# Patient Record
Sex: Female | Born: 1980 | Race: White | Hispanic: No | Marital: Single | Smoking: Never smoker
Health system: Southern US, Community
[De-identification: ages and names within clinical notes are randomized; demographics above are authoritative.]

## PROBLEM LIST (undated history)

## (undated) DIAGNOSIS — I10 Essential (primary) hypertension: Secondary | ICD-10-CM

## (undated) DIAGNOSIS — N809 Endometriosis, unspecified: Secondary | ICD-10-CM

## (undated) DIAGNOSIS — N200 Calculus of kidney: Secondary | ICD-10-CM

## (undated) HISTORY — PX: LAPAROSCOPY: SHX197

---

## 2003-09-29 ENCOUNTER — Emergency Department (HOSPITAL_COMMUNITY): Admission: EM | Admit: 2003-09-29 | Discharge: 2003-09-29 | Payer: Self-pay | Admitting: Physical Therapy

## 2004-07-24 ENCOUNTER — Emergency Department: Payer: Self-pay | Admitting: Emergency Medicine

## 2004-09-06 ENCOUNTER — Emergency Department: Payer: Self-pay | Admitting: Emergency Medicine

## 2004-09-12 ENCOUNTER — Emergency Department: Payer: Self-pay | Admitting: Emergency Medicine

## 2004-09-18 ENCOUNTER — Emergency Department: Payer: Self-pay | Admitting: Emergency Medicine

## 2004-10-08 ENCOUNTER — Other Ambulatory Visit: Payer: Self-pay

## 2004-10-08 ENCOUNTER — Emergency Department: Payer: Self-pay | Admitting: Emergency Medicine

## 2004-10-18 ENCOUNTER — Ambulatory Visit: Payer: Self-pay | Admitting: Unknown Physician Specialty

## 2004-11-27 ENCOUNTER — Emergency Department: Payer: Self-pay | Admitting: Emergency Medicine

## 2004-12-20 ENCOUNTER — Emergency Department: Payer: Self-pay | Admitting: Unknown Physician Specialty

## 2005-01-01 ENCOUNTER — Emergency Department: Payer: Self-pay | Admitting: Emergency Medicine

## 2005-02-01 ENCOUNTER — Emergency Department: Payer: Self-pay | Admitting: Emergency Medicine

## 2005-09-21 ENCOUNTER — Ambulatory Visit: Payer: Self-pay | Admitting: Unknown Physician Specialty

## 2005-10-29 ENCOUNTER — Emergency Department: Payer: Self-pay | Admitting: Internal Medicine

## 2006-11-26 ENCOUNTER — Emergency Department: Payer: Self-pay | Admitting: Emergency Medicine

## 2007-02-19 ENCOUNTER — Observation Stay: Payer: Self-pay | Admitting: Obstetrics & Gynecology

## 2007-02-22 ENCOUNTER — Observation Stay: Payer: Self-pay | Admitting: Obstetrics and Gynecology

## 2007-02-26 ENCOUNTER — Observation Stay: Payer: Self-pay | Admitting: Obstetrics and Gynecology

## 2007-03-05 ENCOUNTER — Inpatient Hospital Stay: Payer: Self-pay | Admitting: Obstetrics & Gynecology

## 2007-08-13 ENCOUNTER — Emergency Department: Payer: Self-pay | Admitting: Emergency Medicine

## 2007-12-20 ENCOUNTER — Emergency Department: Payer: Self-pay | Admitting: Emergency Medicine

## 2008-04-14 ENCOUNTER — Ambulatory Visit: Payer: Self-pay | Admitting: Internal Medicine

## 2008-07-24 ENCOUNTER — Emergency Department: Payer: Self-pay | Admitting: Emergency Medicine

## 2008-08-23 ENCOUNTER — Emergency Department: Payer: Self-pay | Admitting: Emergency Medicine

## 2008-09-22 ENCOUNTER — Ambulatory Visit: Payer: Self-pay | Admitting: Pain Medicine

## 2008-10-06 ENCOUNTER — Ambulatory Visit: Payer: Self-pay | Admitting: Internal Medicine

## 2008-11-11 ENCOUNTER — Emergency Department: Payer: Self-pay | Admitting: Unknown Physician Specialty

## 2008-11-23 ENCOUNTER — Ambulatory Visit: Payer: Self-pay | Admitting: Pain Medicine

## 2009-03-25 ENCOUNTER — Emergency Department: Payer: Self-pay | Admitting: Emergency Medicine

## 2009-04-13 ENCOUNTER — Emergency Department: Payer: Self-pay | Admitting: Emergency Medicine

## 2009-04-24 ENCOUNTER — Ambulatory Visit: Payer: Self-pay | Admitting: Family Medicine

## 2009-06-12 ENCOUNTER — Emergency Department: Payer: Self-pay | Admitting: Emergency Medicine

## 2009-07-18 ENCOUNTER — Emergency Department: Payer: Self-pay | Admitting: Emergency Medicine

## 2009-09-24 ENCOUNTER — Emergency Department: Payer: Self-pay | Admitting: Emergency Medicine

## 2009-10-02 ENCOUNTER — Emergency Department: Payer: Self-pay | Admitting: Emergency Medicine

## 2009-11-02 ENCOUNTER — Emergency Department: Payer: Self-pay | Admitting: Emergency Medicine

## 2009-12-12 ENCOUNTER — Emergency Department: Payer: Self-pay | Admitting: Emergency Medicine

## 2009-12-14 ENCOUNTER — Emergency Department: Payer: Self-pay | Admitting: Internal Medicine

## 2009-12-27 ENCOUNTER — Emergency Department: Payer: Self-pay | Admitting: Emergency Medicine

## 2010-03-09 ENCOUNTER — Emergency Department: Payer: Self-pay | Admitting: Emergency Medicine

## 2010-08-23 ENCOUNTER — Emergency Department: Payer: Self-pay | Admitting: Emergency Medicine

## 2010-11-26 ENCOUNTER — Emergency Department: Payer: Self-pay | Admitting: *Deleted

## 2011-01-05 ENCOUNTER — Emergency Department: Payer: Self-pay | Admitting: Unknown Physician Specialty

## 2011-03-17 ENCOUNTER — Emergency Department: Payer: Self-pay | Admitting: Emergency Medicine

## 2011-05-24 IMAGING — CR RIGHT FOOT COMPLETE - 3+ VIEW
1 series · 3 of 3 positions shown · non-contrast
Comparison: none

REASON FOR EXAM: right foot pain s/p fall
COMMENTS:

PROCEDURE:     DXR - DXR FOOT RT COMPLETE W/OBLIQUES  - December 12, 2009  [DATE]
RESULT:     There is an ununited fracture at the base of the fifth
metatarsal. This has been present previously. No new fractures are seen. No
radiodense soft tissue foreign body is observed.

[Series 1: view not recorded · 0.17mm/px · 3 of 3 slices shown]
[im 1/3]
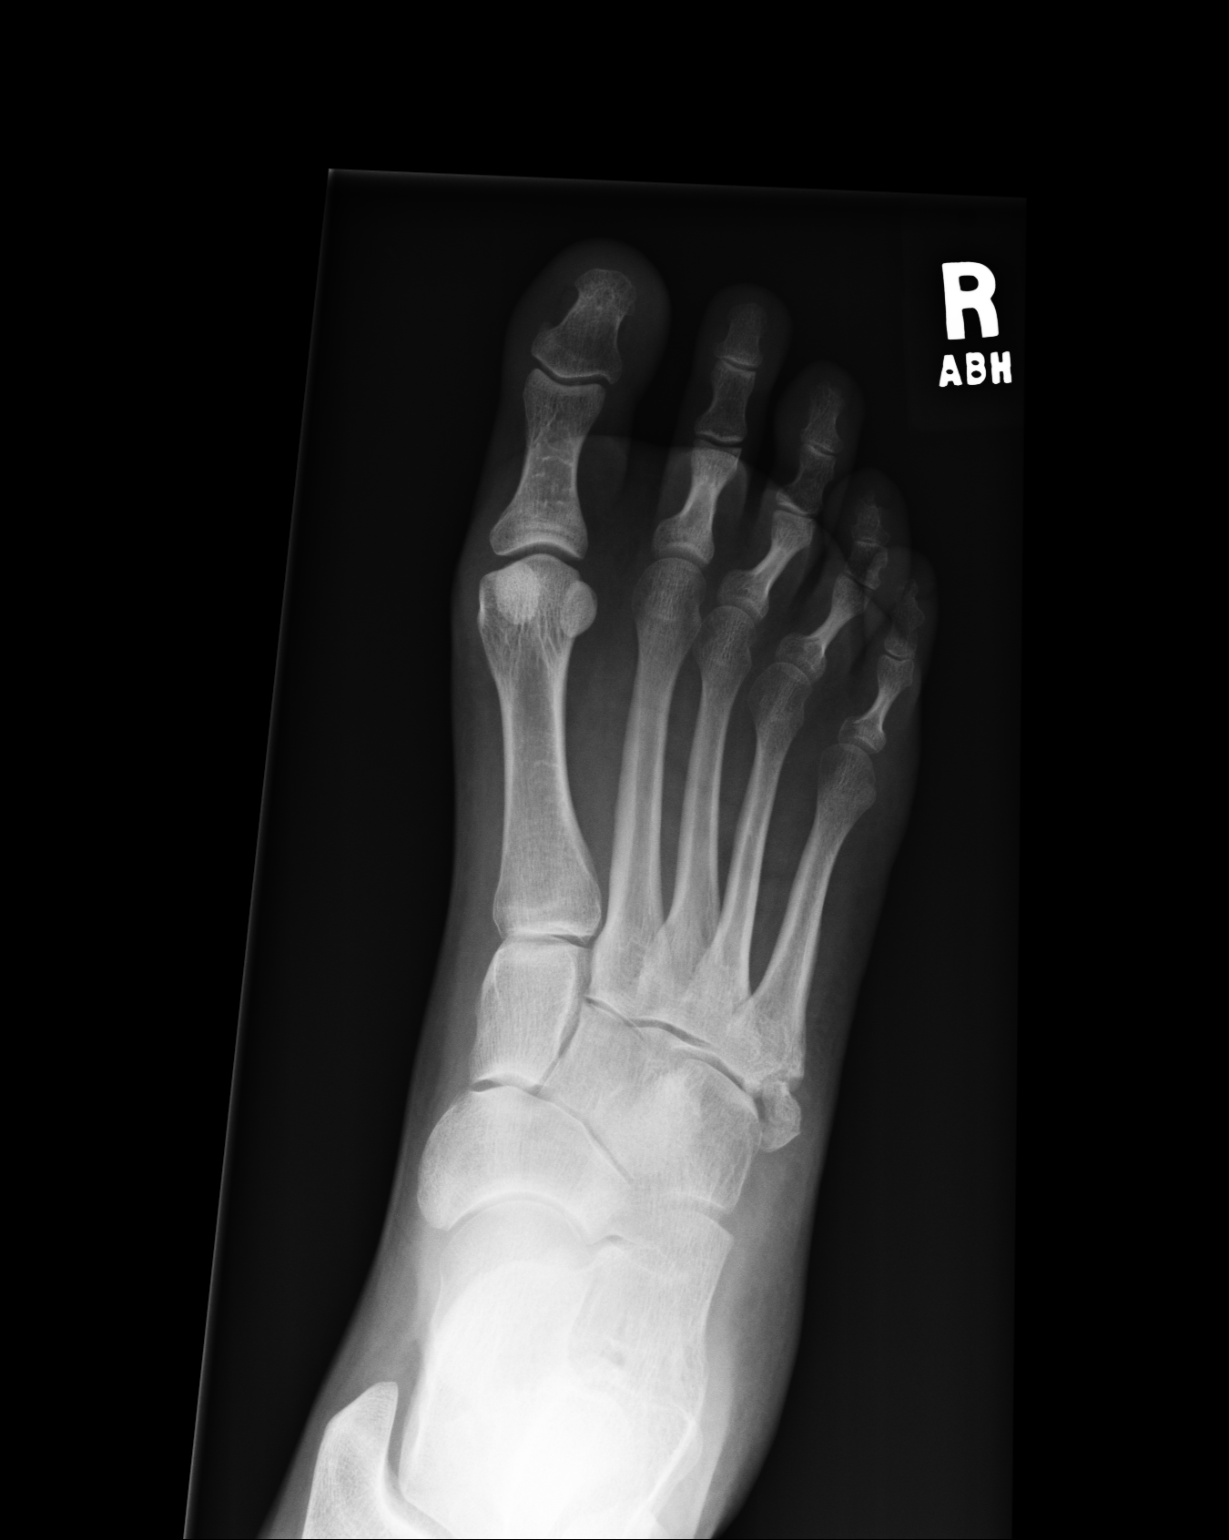
[im 2/3]
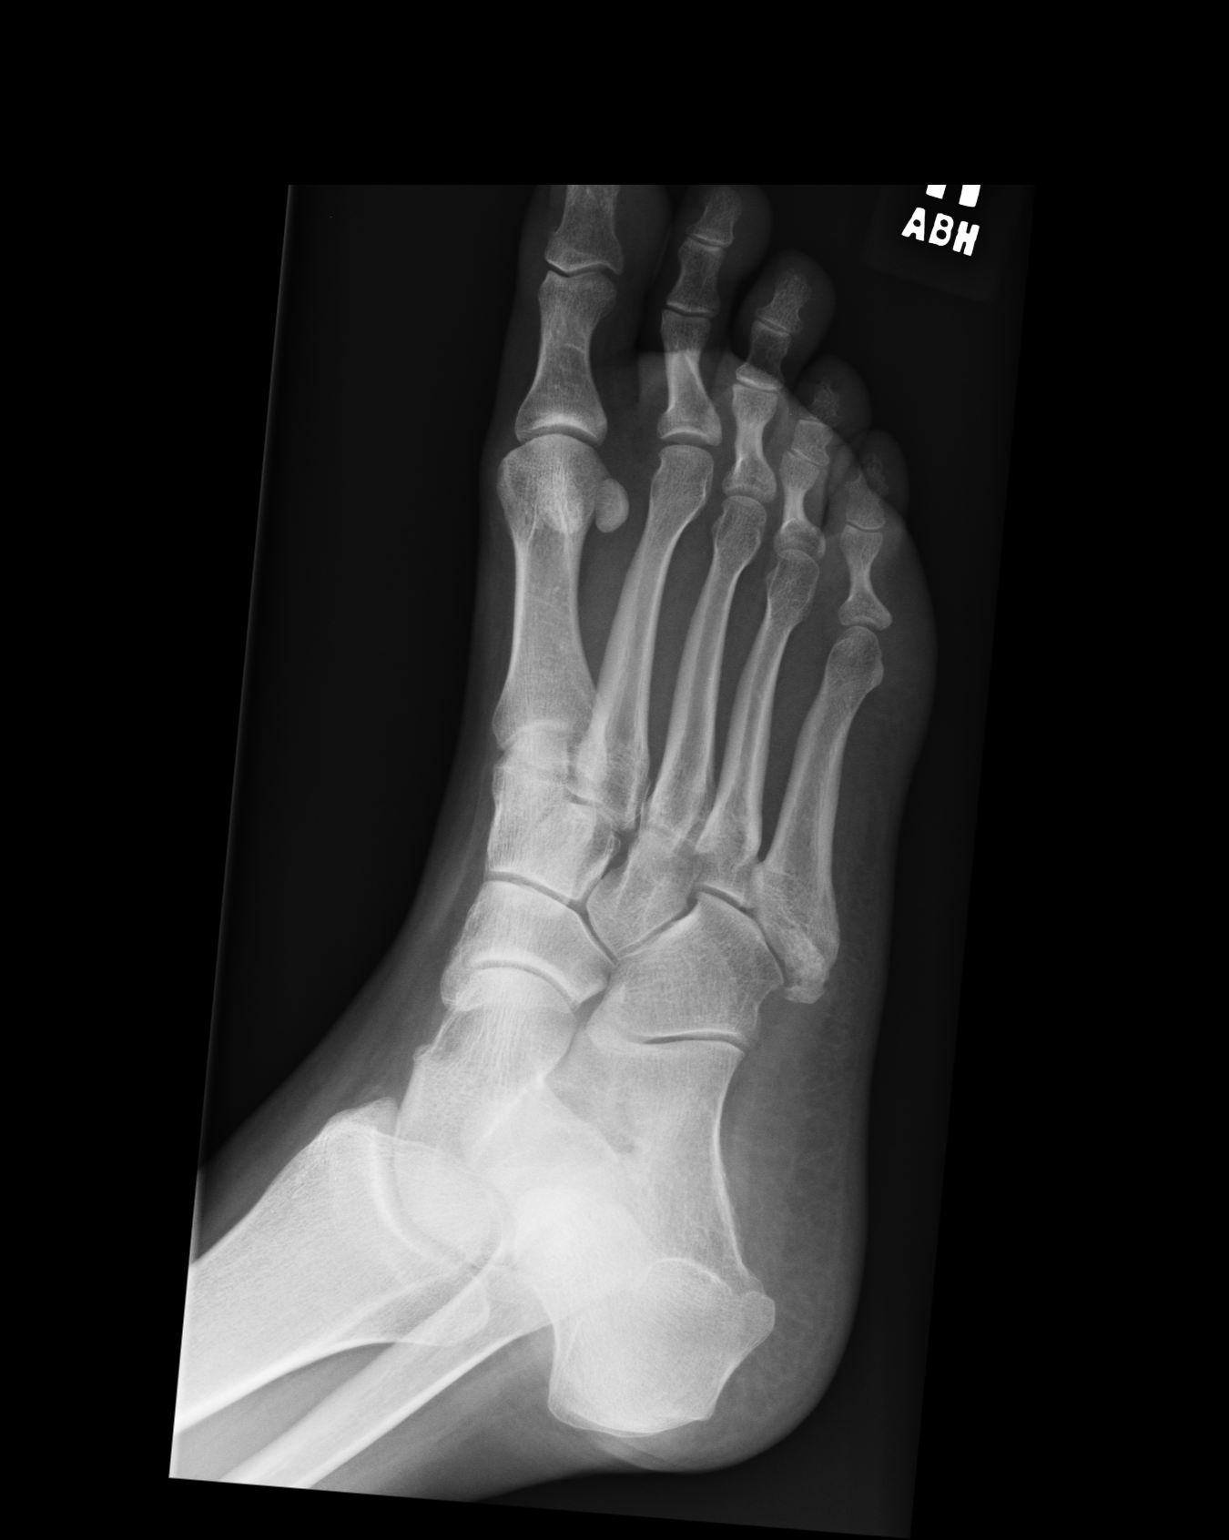
[im 3/3]
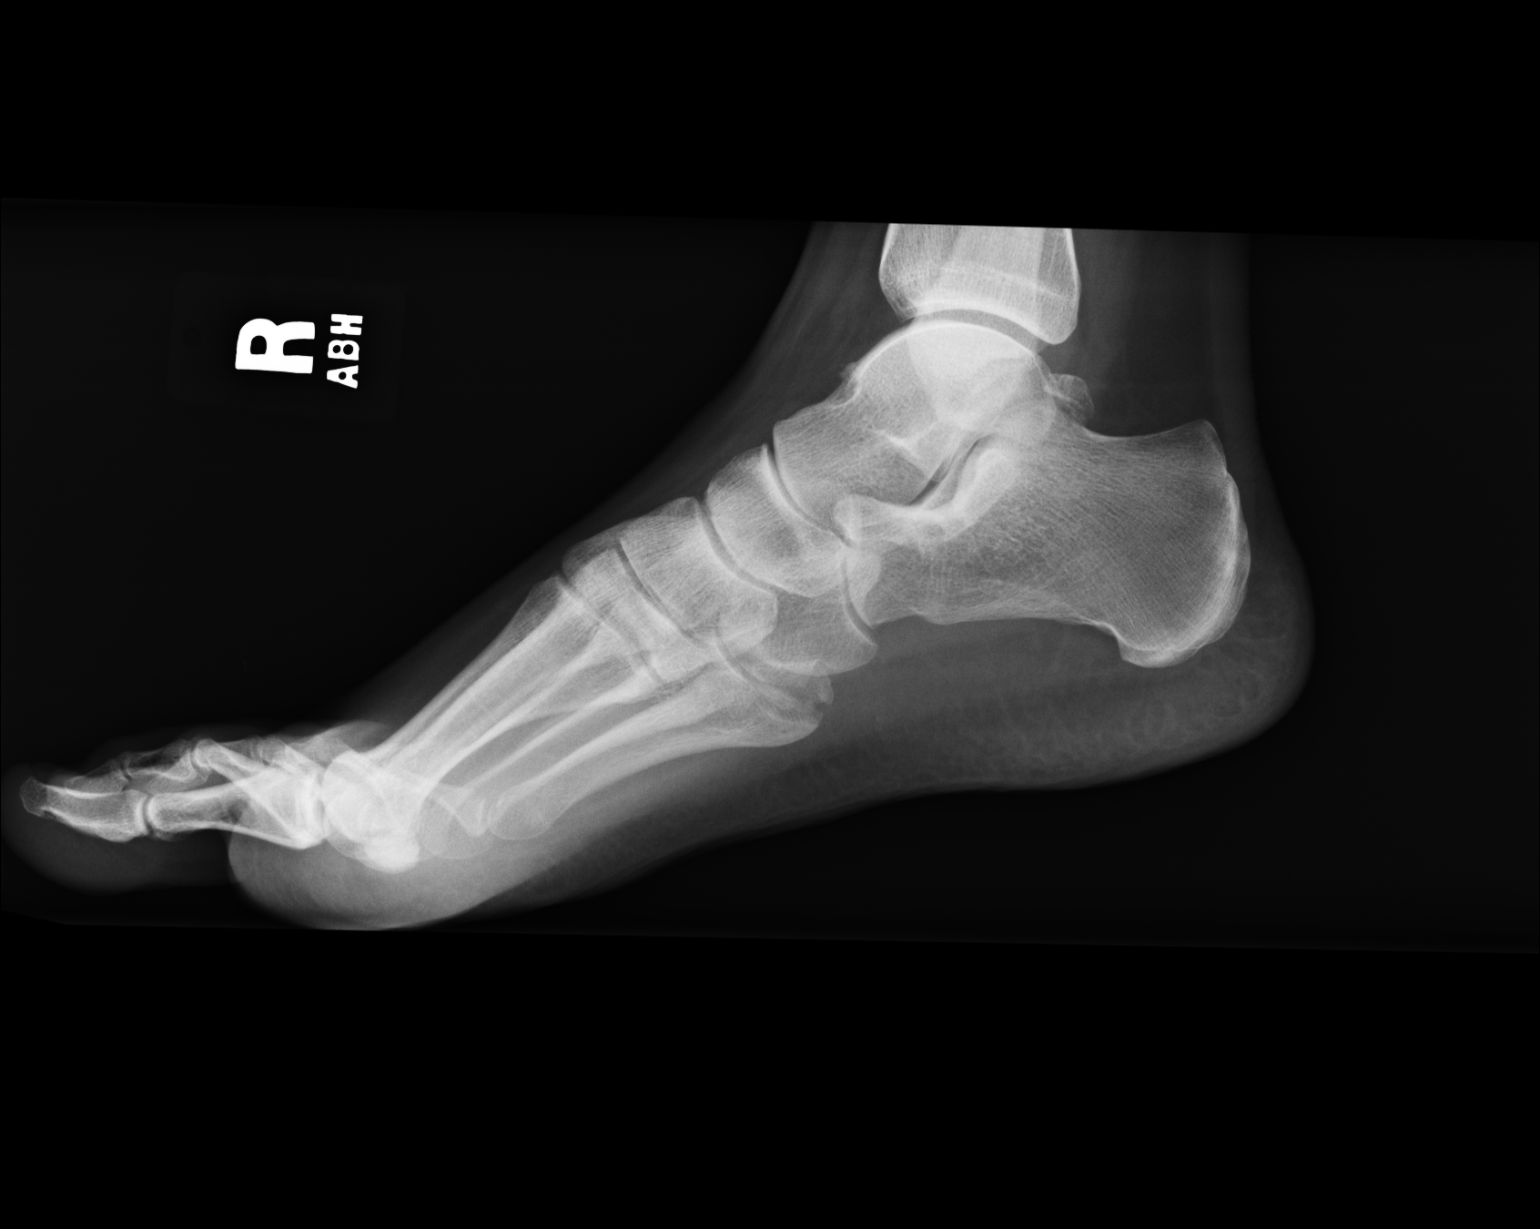

[3 of 3 positions shown; findings below may reference images not displayed]

IMPRESSION: 1. There is an old unhealed fracture at the base of the fifth metatarsal.
2. No acute bony abnormalities are identified.

## 2011-05-24 IMAGING — CR DG CHEST 2V
1 series · 2 of 2 positions shown · non-contrast
Comparison: none

REASON FOR EXAM: chest wall pain
COMMENTS:

PROCEDURE:     DXR - DXR CHEST PA (OR AP) AND LATERAL  - December 12, 2009  [DATE]
RESULT:     Comparison is made to s prior exam of 06/12/2009. The lung fields
are clear. No pneumonia, pneumothorax or pleural effusion is seen. Heart
size is normal.

[Series 1: view not recorded · 0.17mm/px · 2 of 2 slices shown]
[im 1/2]
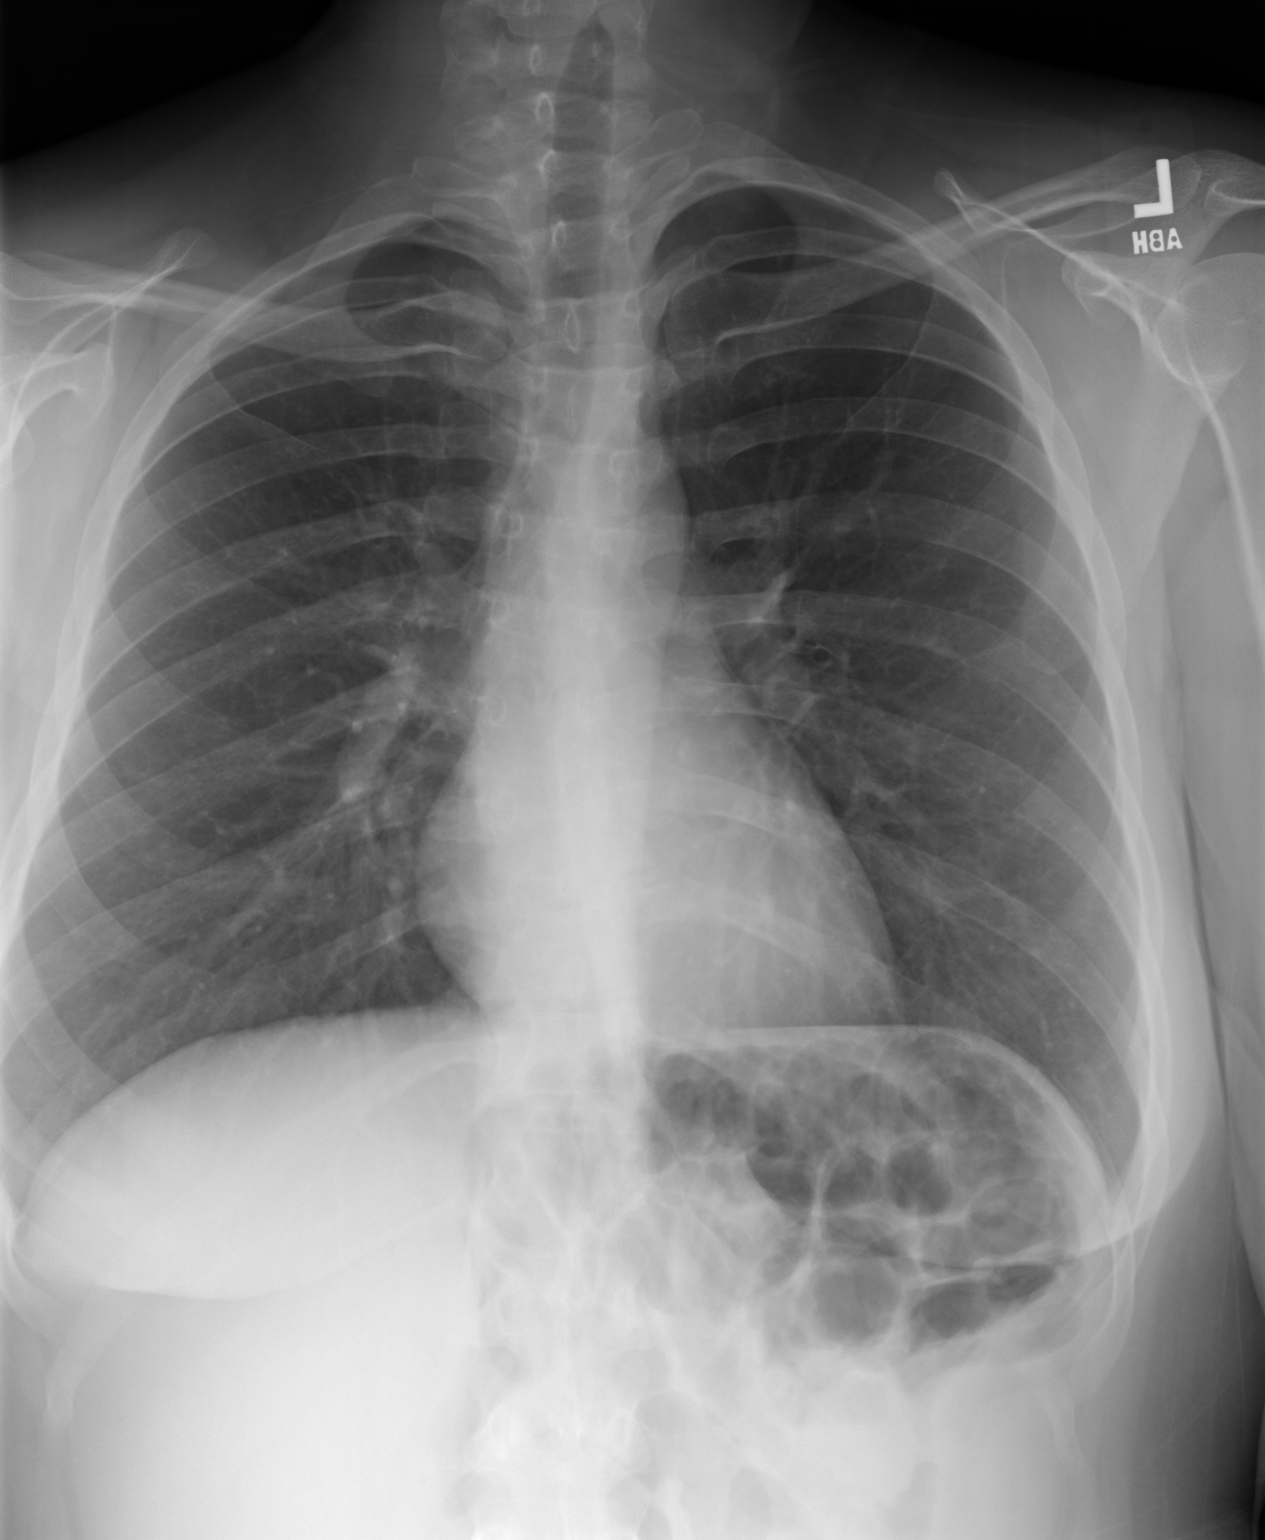
[im 2/2]
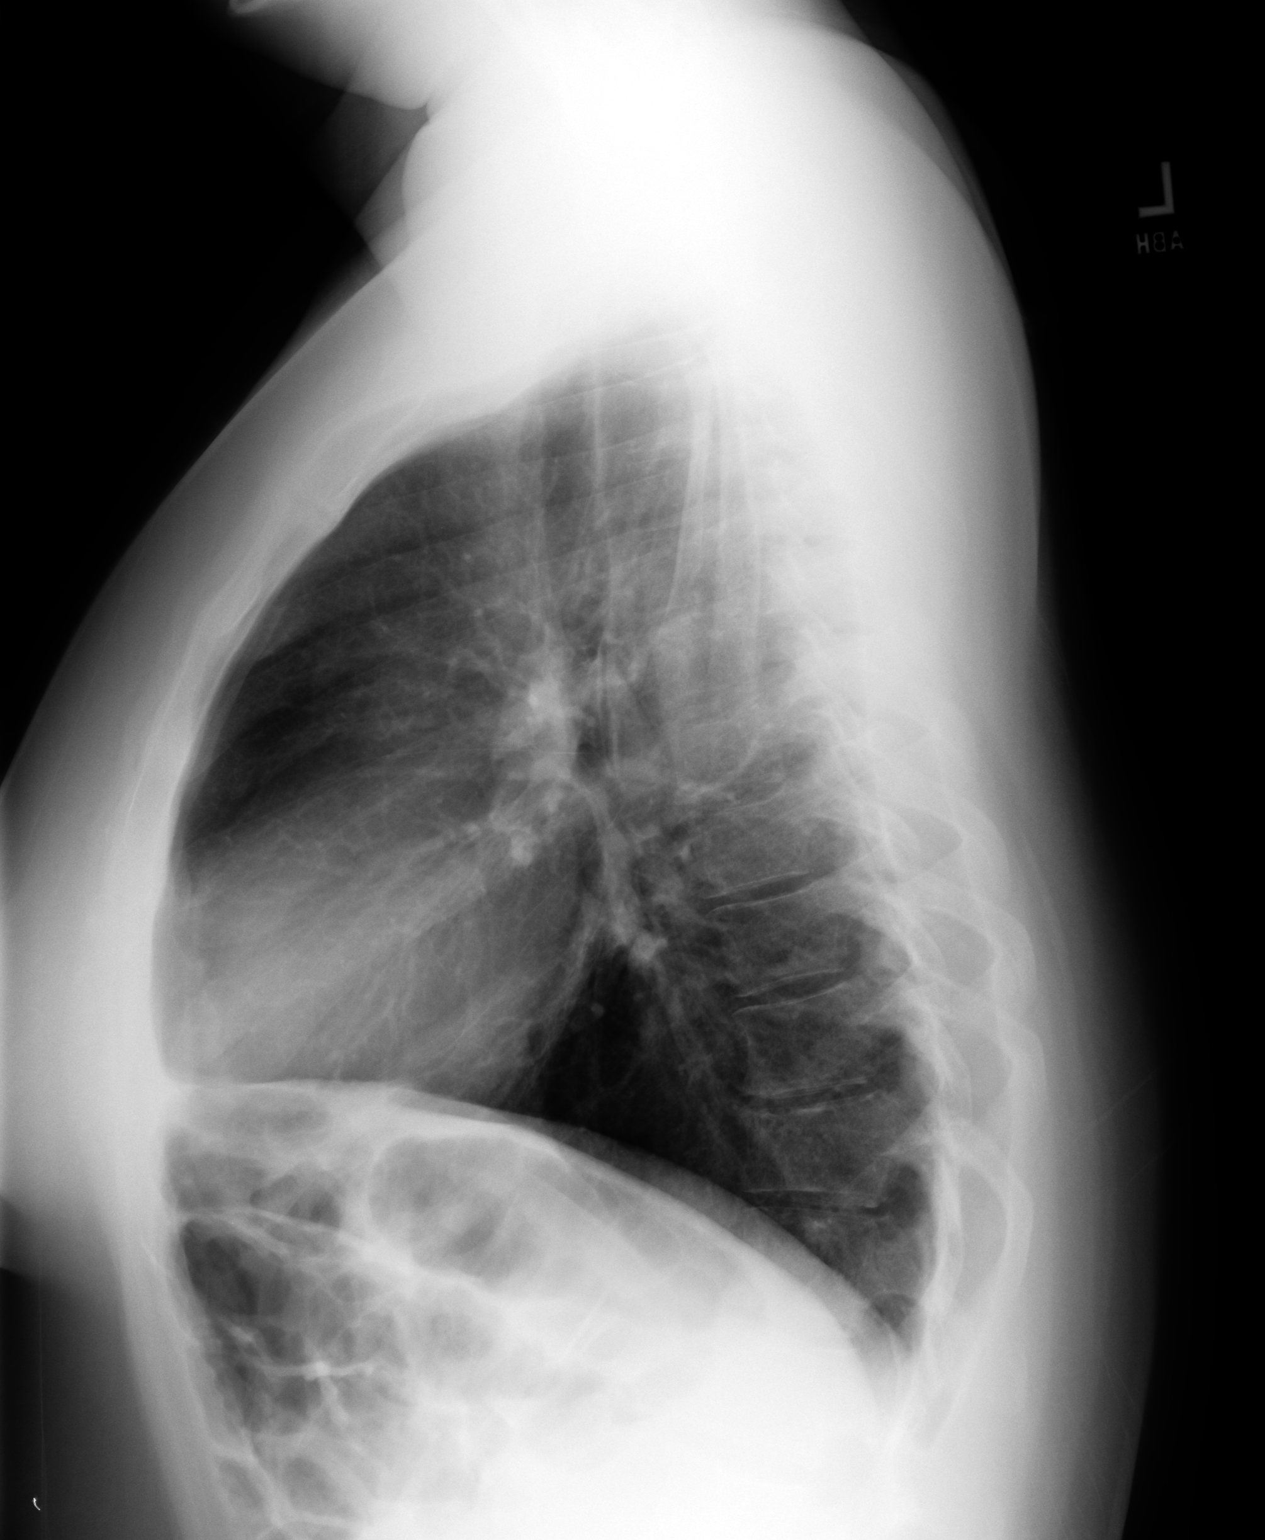

[2 of 2 positions shown; findings below may reference images not displayed]

IMPRESSION: No acute changes are identified.

## 2011-05-24 IMAGING — CR DG THORACIC SPINE 2-3V
1 series · 2 of 2 positions shown · non-contrast
Comparison: none

REASON FOR EXAM: back pain s/p fall
COMMENTS:

[Series 1: view not recorded · 0.17mm/px · 2 of 2 slices shown]
[im 1/2]
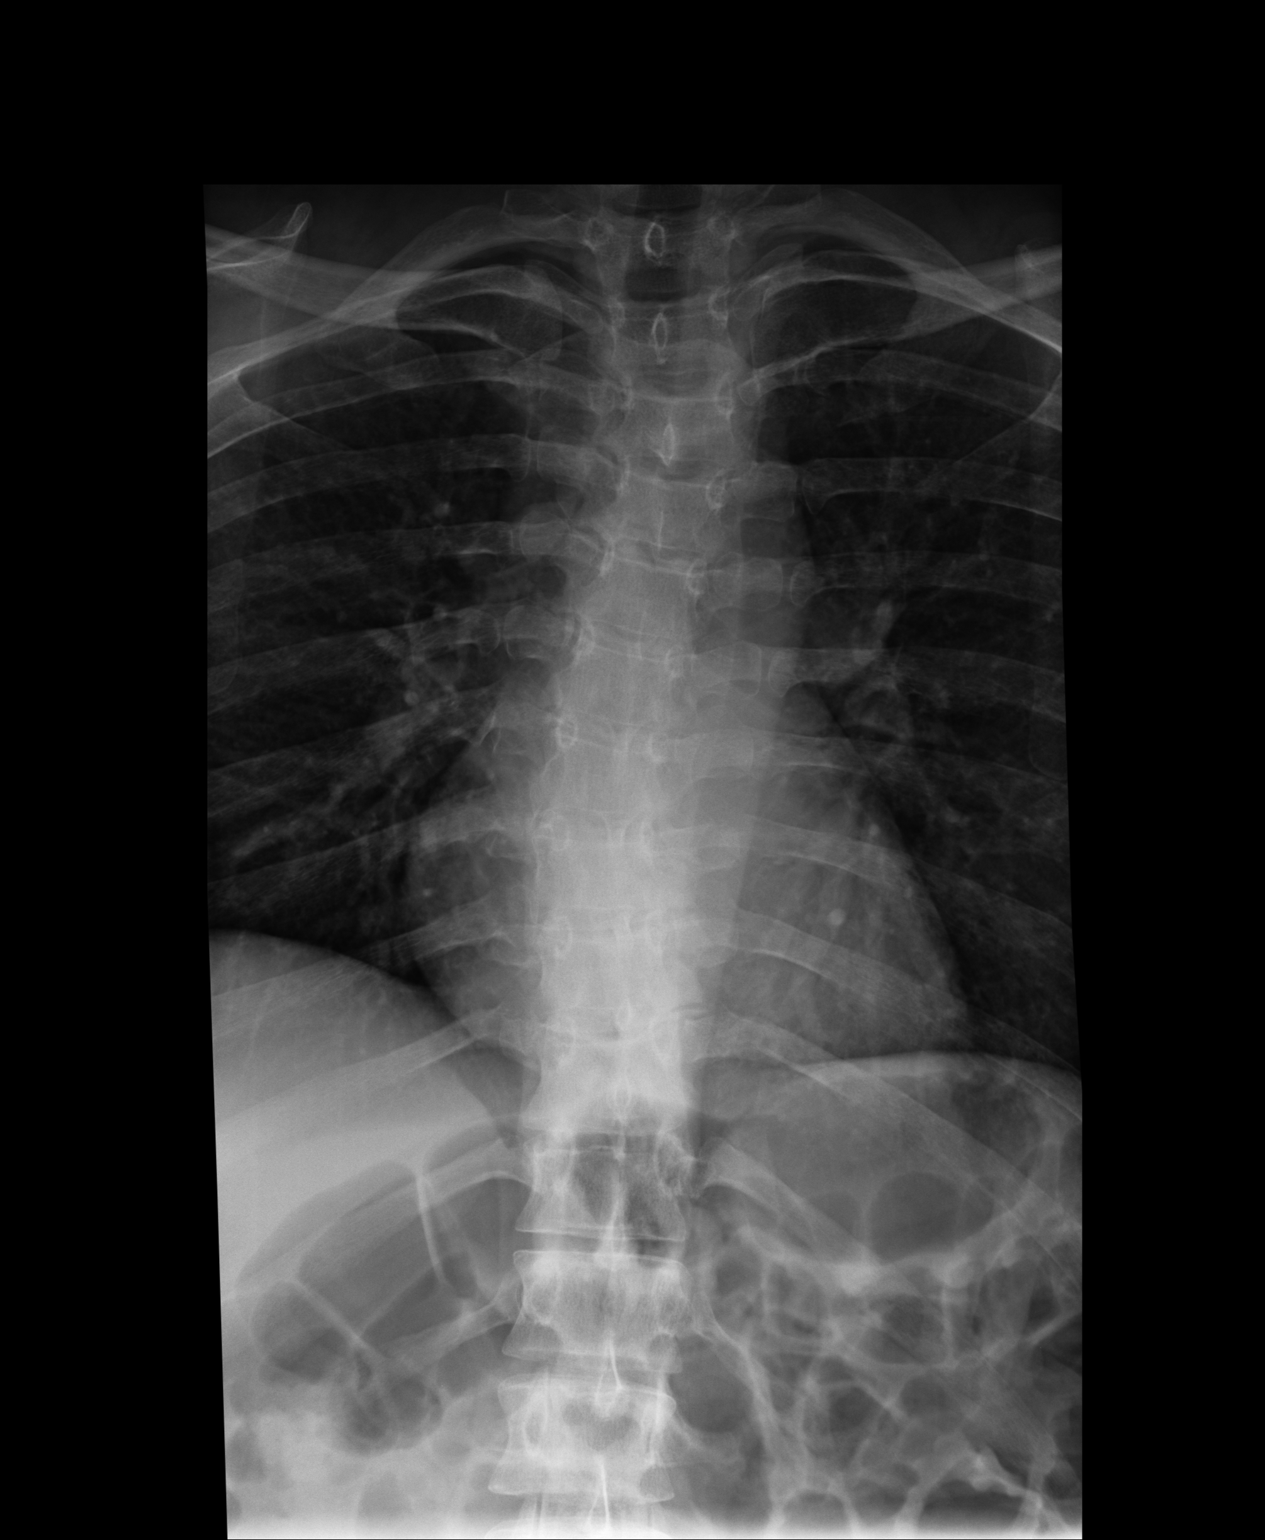
[im 2/2]
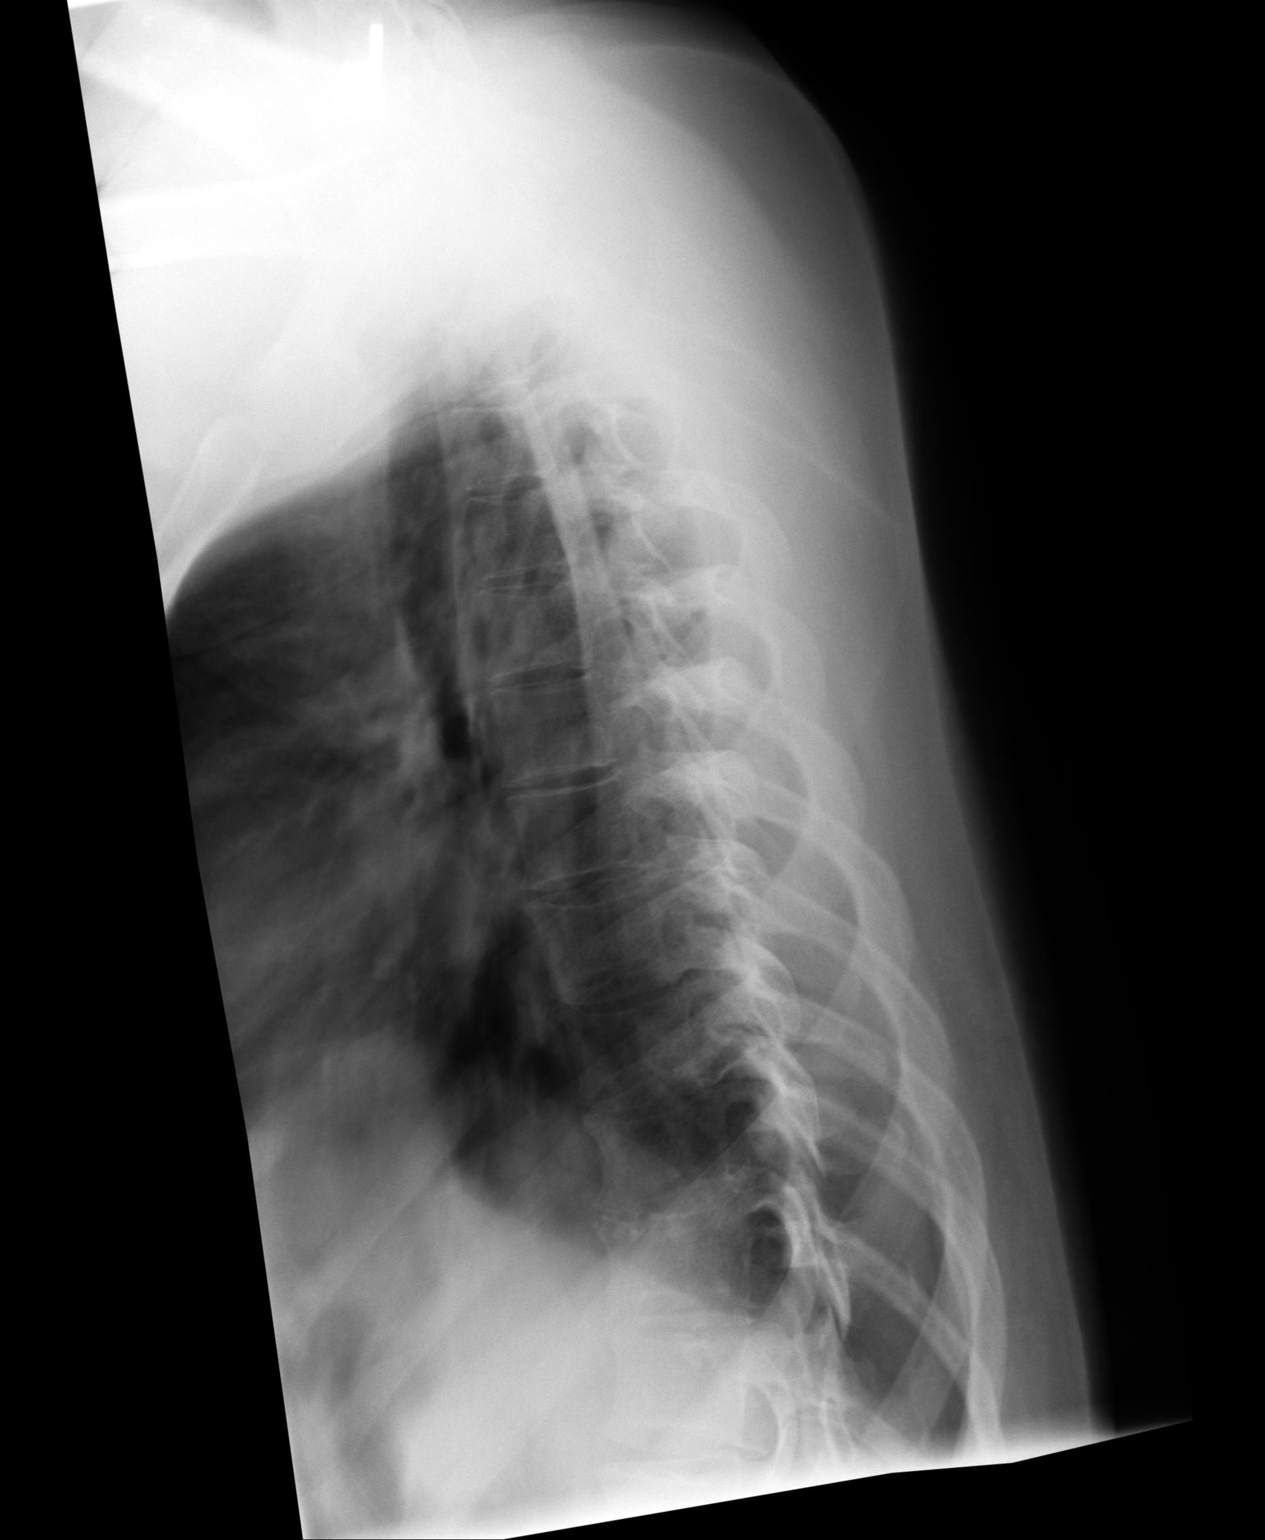

[2 of 2 positions shown; findings below may reference images not displayed]

PROCEDURE:     DXR - DXR THORACIC  AP AND LATERAL  - December 12, 2009  [DATE]

RESULT:     There is slight anterior wedge deformity of T11. This was also
present on a prior exam of 07/18/2009. No acute bony abnormalities are
identified. No fracture is seen. The vertebral body alignment is normal. The
pedicles are bilaterally intact. Incidental note is made of a slight
thoracic scoliosis with a convexity to the right.
IMPRESSION: No acute bony abnormalities are identified.

## 2011-06-08 ENCOUNTER — Emergency Department: Payer: Self-pay | Admitting: Emergency Medicine

## 2011-08-21 ENCOUNTER — Emergency Department: Payer: Self-pay | Admitting: Emergency Medicine

## 2011-11-30 ENCOUNTER — Emergency Department: Payer: Self-pay | Admitting: Emergency Medicine

## 2011-12-04 LAB — WOUND CULTURE

## 2011-12-05 ENCOUNTER — Emergency Department: Payer: Self-pay | Admitting: Emergency Medicine

## 2011-12-09 ENCOUNTER — Emergency Department: Payer: Self-pay | Admitting: Emergency Medicine

## 2012-01-25 ENCOUNTER — Emergency Department: Payer: Self-pay | Admitting: Emergency Medicine

## 2012-01-25 LAB — URINALYSIS, COMPLETE
Bilirubin,UR: NEGATIVE
Ph: 5 (ref 4.5–8.0)
RBC,UR: NONE SEEN /HPF (ref 0–5)
Specific Gravity: 1.033 (ref 1.003–1.030)
Squamous Epithelial: NONE SEEN
WBC UR: NONE SEEN /HPF (ref 0–5)

## 2012-02-02 ENCOUNTER — Emergency Department: Payer: Self-pay | Admitting: Emergency Medicine

## 2012-03-01 ENCOUNTER — Emergency Department: Payer: Self-pay | Admitting: Emergency Medicine

## 2012-03-09 ENCOUNTER — Emergency Department: Payer: Self-pay | Admitting: Emergency Medicine

## 2012-06-07 ENCOUNTER — Emergency Department: Payer: Self-pay | Admitting: Emergency Medicine

## 2012-07-17 ENCOUNTER — Emergency Department: Payer: Self-pay | Admitting: Unknown Physician Specialty

## 2012-07-31 ENCOUNTER — Emergency Department: Payer: Self-pay | Admitting: Internal Medicine

## 2012-08-29 ENCOUNTER — Emergency Department: Payer: Self-pay | Admitting: Emergency Medicine

## 2012-09-01 ENCOUNTER — Emergency Department: Payer: Self-pay | Admitting: Emergency Medicine

## 2012-12-04 ENCOUNTER — Emergency Department: Payer: Self-pay | Admitting: Emergency Medicine

## 2012-12-05 ENCOUNTER — Emergency Department: Payer: Self-pay | Admitting: Emergency Medicine

## 2012-12-20 ENCOUNTER — Emergency Department: Payer: Self-pay | Admitting: Emergency Medicine

## 2012-12-20 LAB — BASIC METABOLIC PANEL
Anion Gap: 4 — ABNORMAL LOW (ref 7–16)
BUN: 5 mg/dL — ABNORMAL LOW (ref 7–18)
Calcium, Total: 9.5 mg/dL (ref 8.5–10.1)
Co2: 30 mmol/L (ref 21–32)
EGFR (African American): >60
Sodium: 138 mmol/L (ref 136–145)

## 2012-12-20 LAB — CBC
HGB: 12.7 g/dL (ref 12.0–16.0)
MCHC: 33.7 g/dL (ref 32.0–36.0)
MCV: 87 fL (ref 80–100)
RBC: 4.32 10*6/uL (ref 3.80–5.20)
WBC: 5.6 10*3/uL (ref 3.6–11.0)

## 2012-12-24 LAB — WOUND CULTURE

## 2013-01-24 ENCOUNTER — Emergency Department: Payer: Self-pay | Admitting: Emergency Medicine

## 2013-07-01 ENCOUNTER — Emergency Department: Payer: Self-pay | Admitting: Emergency Medicine

## 2013-07-01 LAB — URINALYSIS, COMPLETE
BILIRUBIN, UR: NEGATIVE
BLOOD: NEGATIVE
Bacteria: NONE SEEN
GLUCOSE, UR: NEGATIVE mg/dL (ref 0–75)
Ketone: NEGATIVE
LEUKOCYTE ESTERASE: NEGATIVE
Nitrite: NEGATIVE
PROTEIN: NEGATIVE
Ph: 6 (ref 4.5–8.0)
RBC, UR: NONE SEEN /HPF (ref 0–5)
SPECIFIC GRAVITY: 1.003 (ref 1.003–1.030)
Squamous Epithelial: <1

## 2013-07-04 ENCOUNTER — Emergency Department: Payer: Self-pay | Admitting: Emergency Medicine

## 2013-07-24 ENCOUNTER — Emergency Department: Payer: Self-pay | Admitting: Emergency Medicine

## 2013-07-25 LAB — URINALYSIS, COMPLETE
BACTERIA: NONE SEEN
BLOOD: NEGATIVE
Bilirubin,UR: NEGATIVE
Glucose,UR: NEGATIVE mg/dL (ref 0–75)
KETONE: NEGATIVE
LEUKOCYTE ESTERASE: NEGATIVE
NITRITE: NEGATIVE
Ph: 6 (ref 4.5–8.0)
RBC,UR: 2 /HPF (ref 0–5)
SPECIFIC GRAVITY: 1.021 (ref 1.003–1.030)

## 2013-07-25 LAB — COMPREHENSIVE METABOLIC PANEL
ALBUMIN: 3.7 g/dL (ref 3.4–5.0)
ALK PHOS: 44 U/L — AB
ALT: 15 U/L (ref 12–78)
ANION GAP: 7 (ref 7–16)
AST: 25 U/L (ref 15–37)
BILIRUBIN TOTAL: 0.5 mg/dL (ref 0.2–1.0)
BUN: 10 mg/dL (ref 7–18)
CHLORIDE: 107 mmol/L (ref 98–107)
CREATININE: 1.07 mg/dL (ref 0.60–1.30)
Calcium, Total: 8.8 mg/dL (ref 8.5–10.1)
Co2: 29 mmol/L (ref 21–32)
EGFR (African American): >60
EGFR (Non-African Amer.): >60
Glucose: 93 mg/dL (ref 65–99)
OSMOLALITY: 284 (ref 275–301)
Potassium: 2.8 mmol/L — ABNORMAL LOW (ref 3.5–5.1)
SODIUM: 143 mmol/L (ref 136–145)
TOTAL PROTEIN: 7 g/dL (ref 6.4–8.2)

## 2013-07-25 LAB — CBC WITH DIFFERENTIAL/PLATELET
BASOS PCT: 0.6 %
Basophil #: 0 10*3/uL (ref 0.0–0.1)
EOS ABS: 0.1 10*3/uL (ref 0.0–0.7)
Eosinophil %: 1.5 %
HCT: 36.1 % (ref 35.0–47.0)
HGB: 11.9 g/dL — AB (ref 12.0–16.0)
LYMPHS PCT: 36.8 %
Lymphocyte #: 2.6 10*3/uL (ref 1.0–3.6)
MCH: 29.5 pg (ref 26.0–34.0)
MCHC: 33 g/dL (ref 32.0–36.0)
MCV: 90 fL (ref 80–100)
MONO ABS: 0.5 x10 3/mm (ref 0.2–0.9)
MONOS PCT: 7.6 %
NEUTROS ABS: 3.7 10*3/uL (ref 1.4–6.5)
NEUTROS PCT: 53.5 %
PLATELETS: 272 10*3/uL (ref 150–440)
RBC: 4.03 10*6/uL (ref 3.80–5.20)
RDW: 15.3 % — AB (ref 11.5–14.5)
WBC: 7 10*3/uL (ref 3.6–11.0)

## 2013-07-25 LAB — DRUG SCREEN, URINE
AMPHETAMINES, UR SCREEN: NEGATIVE (ref ?–1000)
BARBITURATES, UR SCREEN: NEGATIVE (ref ?–200)
Benzodiazepine, Ur Scrn: NEGATIVE (ref ?–200)
CANNABINOID 50 NG, UR ~~LOC~~: NEGATIVE (ref ?–50)
COCAINE METABOLITE, UR ~~LOC~~: POSITIVE (ref ?–300)
MDMA (ECSTASY) UR SCREEN: NEGATIVE (ref ?–500)
METHADONE, UR SCREEN: NEGATIVE (ref ?–300)
Opiate, Ur Screen: NEGATIVE (ref ?–300)
PHENCYCLIDINE (PCP) UR S: NEGATIVE (ref ?–25)
Tricyclic, Ur Screen: NEGATIVE (ref ?–1000)

## 2013-08-11 ENCOUNTER — Emergency Department: Payer: Self-pay | Admitting: Emergency Medicine

## 2014-02-01 ENCOUNTER — Emergency Department: Payer: Self-pay | Admitting: Student

## 2014-03-28 ENCOUNTER — Emergency Department: Payer: Self-pay | Admitting: Emergency Medicine

## 2014-03-28 LAB — BASIC METABOLIC PANEL
ANION GAP: 9 (ref 7–16)
BUN: 14 mg/dL (ref 7–18)
CALCIUM: 9.1 mg/dL (ref 8.5–10.1)
CHLORIDE: 103 mmol/L (ref 98–107)
CO2: 26 mmol/L (ref 21–32)
CREATININE: 1.22 mg/dL (ref 0.60–1.30)
EGFR (Non-African Amer.): 54 — ABNORMAL LOW
Glucose: 96 mg/dL (ref 65–99)
Osmolality: 276 (ref 275–301)
Potassium: 3.6 mmol/L (ref 3.5–5.1)
Sodium: 138 mmol/L (ref 136–145)

## 2014-03-28 LAB — CBC
HCT: 37.3 % (ref 35.0–47.0)
HGB: 12.6 g/dL (ref 12.0–16.0)
MCH: 29.3 pg (ref 26.0–34.0)
MCHC: 33.7 g/dL (ref 32.0–36.0)
MCV: 87 fL (ref 80–100)
Platelet: 319 10*3/uL (ref 150–440)
RBC: 4.29 10*6/uL (ref 3.80–5.20)
RDW: 13 % (ref 11.5–14.5)
WBC: 9.9 10*3/uL (ref 3.6–11.0)

## 2014-03-28 LAB — URINALYSIS, COMPLETE
Bilirubin,UR: NEGATIVE
Blood: NEGATIVE
Glucose,UR: NEGATIVE mg/dL (ref 0–75)
KETONE: NEGATIVE
LEUKOCYTE ESTERASE: NEGATIVE
Nitrite: NEGATIVE
PH: 5 (ref 4.5–8.0)
PROTEIN: NEGATIVE
Specific Gravity: 1.025 (ref 1.003–1.030)
Squamous Epithelial: 2

## 2014-06-05 ENCOUNTER — Emergency Department
Admit: 2014-06-05 | Disposition: A | Discharge status: Home / Self Care | Payer: Self-pay | Admitting: Emergency Medicine

## 2014-09-28 ENCOUNTER — Other Ambulatory Visit: Payer: Self-pay

## 2014-09-28 ENCOUNTER — Encounter: Payer: Self-pay | Admitting: *Deleted

## 2014-09-28 ENCOUNTER — Emergency Department
Admission: EM | Admit: 2014-09-28 | Discharge: 2014-09-28 | Disposition: A | Discharge status: Home / Self Care | Payer: Self-pay | Attending: Emergency Medicine | Admitting: Emergency Medicine

## 2014-09-28 ENCOUNTER — Emergency Department: Payer: Self-pay

## 2014-09-28 DIAGNOSIS — W500XXA Accidental hit or strike by another person, initial encounter: Secondary | ICD-10-CM | POA: Insufficient documentation

## 2014-09-28 DIAGNOSIS — M79602 Pain in left arm: Secondary | ICD-10-CM

## 2014-09-28 DIAGNOSIS — Y9289 Other specified places as the place of occurrence of the external cause: Secondary | ICD-10-CM | POA: Insufficient documentation

## 2014-09-28 DIAGNOSIS — Y990 Civilian activity done for income or pay: Secondary | ICD-10-CM | POA: Insufficient documentation

## 2014-09-28 DIAGNOSIS — I10 Essential (primary) hypertension: Secondary | ICD-10-CM | POA: Insufficient documentation

## 2014-09-28 DIAGNOSIS — Y9389 Activity, other specified: Secondary | ICD-10-CM | POA: Insufficient documentation

## 2014-09-28 DIAGNOSIS — S4992XA Unspecified injury of left shoulder and upper arm, initial encounter: Secondary | ICD-10-CM | POA: Insufficient documentation

## 2014-09-28 HISTORY — DX: Essential (primary) hypertension: I10

## 2014-09-28 MED ORDER — IBUPROFEN 800 MG PO TABS
800.0000 mg | ORAL_TABLET | Freq: Once | ORAL | Status: AC
  Administered 2014-09-28: 800 mg via ORAL
  Filled 2014-09-28: qty 1

## 2014-09-28 MED ORDER — OXYCODONE-ACETAMINOPHEN 5-325 MG PO TABS
2.0000 | ORAL_TABLET | Freq: Once | ORAL | Status: AC
  Administered 2014-09-28: 2 via ORAL
  Filled 2014-09-28: qty 2

## 2014-09-28 MED ORDER — IBUPROFEN 800 MG PO TABS: 800.0000 mg | ORAL_TABLET | Freq: Three times a day (TID) | ORAL | Status: DC | PRN
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
## 2014-09-28 NOTE — Discharge Instructions (Signed)
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
## 2015-04-22 ENCOUNTER — Emergency Department: Payer: Medicaid Other

## 2015-04-22 ENCOUNTER — Emergency Department
Admission: EM | Admit: 2015-04-22 | Discharge: 2015-04-22 | Disposition: A | Discharge status: Home / Self Care | Payer: Medicaid Other | Attending: Emergency Medicine | Admitting: Emergency Medicine

## 2015-04-22 DIAGNOSIS — G43909 Migraine, unspecified, not intractable, without status migrainosus: Secondary | ICD-10-CM | POA: Diagnosis not present

## 2015-04-22 DIAGNOSIS — O10011 Pre-existing essential hypertension complicating pregnancy, first trimester: Secondary | ICD-10-CM | POA: Diagnosis not present

## 2015-04-22 DIAGNOSIS — Z3A01 Less than 8 weeks gestation of pregnancy: Secondary | ICD-10-CM | POA: Diagnosis not present

## 2015-04-22 DIAGNOSIS — O9989 Other specified diseases and conditions complicating pregnancy, childbirth and the puerperium: Secondary | ICD-10-CM | POA: Diagnosis present

## 2015-04-22 DIAGNOSIS — O99351 Diseases of the nervous system complicating pregnancy, first trimester: Secondary | ICD-10-CM | POA: Diagnosis not present

## 2015-04-22 LAB — CBC
HCT: 38.1 % (ref 35.0–47.0)
Hemoglobin: 12.9 g/dL (ref 12.0–16.0)
MCH: 29 pg (ref 26.0–34.0)
MCHC: 33.9 g/dL (ref 32.0–36.0)
MCV: 85.7 fL (ref 80.0–100.0)
PLATELETS: 342 10*3/uL (ref 150–440)
RBC: 4.45 MIL/uL (ref 3.80–5.20)
RDW: 16.1 % — ABNORMAL HIGH (ref 11.5–14.5)
WBC: 8.2 10*3/uL (ref 3.6–11.0)

## 2015-04-22 LAB — COMPREHENSIVE METABOLIC PANEL
ALT: 11 U/L — ABNORMAL LOW (ref 14–54)
ANION GAP: 8 (ref 5–15)
AST: 18 U/L (ref 15–41)
Albumin: 3.9 g/dL (ref 3.5–5.0)
Alkaline Phosphatase: 53 U/L (ref 38–126)
BUN: 10 mg/dL (ref 6–20)
CALCIUM: 9.3 mg/dL (ref 8.9–10.3)
CHLORIDE: 104 mmol/L (ref 101–111)
CO2: 27 mmol/L (ref 22–32)
Creatinine, Ser: 0.75 mg/dL (ref 0.44–1.00)
GFR calc non Af Amer: >60 mL/min (ref >60)
Glucose, Bld: 106 mg/dL — ABNORMAL HIGH (ref 65–99)
POTASSIUM: 3.6 mmol/L (ref 3.5–5.1)
SODIUM: 139 mmol/L (ref 135–145)
Total Bilirubin: 0.3 mg/dL (ref 0.3–1.2)
Total Protein: 7.9 g/dL (ref 6.5–8.1)

## 2015-04-22 MED ORDER — DIPHENHYDRAMINE HCL 50 MG/ML IJ SOLN
50.0000 mg | Freq: Once | INTRAMUSCULAR | Status: AC
  Administered 2015-04-22: 50 mg via INTRAVENOUS
  Filled 2015-04-22: qty 1

## 2015-04-22 MED ORDER — SODIUM CHLORIDE 0.9 % IV BOLUS (SEPSIS)
1000.0000 mL | Freq: Once | INTRAVENOUS | Status: AC
  Administered 2015-04-22: 1000 mL via INTRAVENOUS

## 2015-04-22 MED ORDER — METOCLOPRAMIDE HCL 5 MG/ML IJ SOLN
10.0000 mg | Freq: Once | INTRAMUSCULAR | Status: AC
  Administered 2015-04-22: 10 mg via INTRAVENOUS
  Filled 2015-04-22: qty 2

## 2015-04-22 NOTE — Discharge Instructions (Signed)

## 2015-04-22 NOTE — ED Notes (Signed)
Patient transported to CT 

## 2015-04-22 NOTE — ED Provider Notes (Signed)
[facility] Emergency Department Provider Note  Time seen: 5:13 AM  I have reviewed the triage vital signs and the nursing notes.   HISTORY  Chief Complaint Headache    HPI Lauren Mcmillan is a 35 y.o. female with past medical history of hypertension, [redacted] weeks pregnant, who presents to the emergency department with a headache, elevated blood pressure, dizziness. According to the patient she was feeling very dizzy and nauseated today. She is [redacted] weeks pregnant was concerned so she called EMS. She states upon EMS arrival she was very hypertensive 190 systolic patient, so they transported her to the emergency department for further evaluation. Here the patient states a moderate headache, although states she has a history of headaches and migraines in the past which this feels similar. Patient does state that the headache came on acutely, denies fever. The patient did feel nauseated but denies vomiting. Patient has a history of hypertension, she is supposed to be on blood pressure medications but has not taken them for over a year     Past Medical History  Diagnosis Date  . Hypertension     There are no active problems to display for this patient.   No past surgical history on file.  Current Outpatient Rx  Name  Route  Sig  Dispense  Refill  . ibuprofen (ADVIL,MOTRIN) 800 MG tablet   Oral   Take 1 tablet (800 mg total) by mouth every 8 (eight) hours as needed.   30 tablet   0     Allergies Sulfa antibiotics  No family history on file.  Social History Social History  Substance Use Topics  . Smoking status: Never Smoker   . Smokeless tobacco: Not on file  . Alcohol Use: No    Review of Systems Constitutional: Negative for fever. Cardiovascular: Negative for chest pain. Respiratory: Negative for shortness of breath. Gastrointestinal: Negative for abdominal pain. Positive for nausea. Negative vomiting. Genitourinary: Negative for  dysuria Neurological: Moderate headache. Denies focal weakness or numbness. 10-point ROS otherwise negative.  ____________________________________________   PHYSICAL EXAM:  VITAL SIGNS: ED Triage Vitals  Enc Vitals Group     BP 04/22/15 0356 160/84 mmHg     Pulse Rate 04/22/15 0356 112     Resp 04/22/15 0356 18     Temp 04/22/15 0356 98.2 F (36.8 C)     Temp Source 04/22/15 0356 Oral     SpO2 04/22/15 0356 99 %     Weight 04/22/15 0356 187 lb (84.823 kg)     Height 04/22/15 0356  (1.651 m)     Head Cir --      Peak Flow --      Pain Score 04/22/15 0357 9     Pain Loc --      Pain Edu? --      Excl. in GC? --     Constitutional: Alert and oriented. Well appearing and in no distress. Eyes: Normal exam. Mild photophobia. ENT   Head: Normocephalic and atraumatic.   Mouth/Throat: Mucous membranes are moist. Cardiovascular: Normal rate, regular rhythm. No murmur Respiratory: Normal respiratory effort without tachypnea nor retractions. Breath sounds are clear Gastrointestinal: Soft and nontender. No distention. Musculoskeletal: Nontender with normal range of motion in all extremities.  Neurologic:  Normal speech and language. No gross focal neurologic deficits  Skin:  Skin is warm, dry and intact.  Psychiatric: Mood and affect are normal. Speech and behavior are normal.  ____________________________________________   RADIOLOGY  CT  within normal limits  ____________________________________________    INITIAL IMPRESSION / ASSESSMENT AND PLAN / ED COURSE  Pertinent labs & imaging results that were available during my care of the patient were reviewed by me and considered in my medical decision making (see chart for details).  Patient presents to the emergency department with a headache, hypertension, [redacted] weeks pregnant. We will check basic labs, and treat the patient's migraine with Reglan, Benadryl and IV fluids. Patient does state acute onset, we will  proceed with a CT scan of the head to further evaluate. Patient agreeable to plan. I discussed with the patient the need to follow-up with an OB/GYN as soon as possible regarding prenatal care as well as her hypertension.  CT head negative. Labs are within normal limits. We will discharge the patient home. I have instructed the patient follow up with OB/GYN as soon as possible for further care. Patient is agreeable to plan. ____________________________________________   FINAL CLINICAL IMPRESSION(S) / ED DIAGNOSES  headache Hypertension First trimester pregnancy   Minna Antis, MD 04/22/15 [phone]

## 2015-04-22 NOTE — ED Notes (Addendum)
PT in with co headache hx of the same, has a hx of htn but has not taken meds for a year.    Pt is approx [redacted] weeks pregnant with no pregnancy complaints.

## 2015-08-24 ENCOUNTER — Ambulatory Visit: Payer: Medicaid Other | Attending: Obstetrics and Gynecology | Admitting: Physical Therapy

## 2016-03-20 ENCOUNTER — Encounter: Payer: Self-pay | Admitting: *Deleted

## 2016-03-20 ENCOUNTER — Emergency Department
Admission: EM | Admit: 2016-03-20 | Discharge: 2016-03-20 | Disposition: A | Discharge status: Home / Self Care | Payer: Medicaid Other | Attending: Emergency Medicine | Admitting: Emergency Medicine

## 2016-03-20 DIAGNOSIS — M791 Myalgia: Secondary | ICD-10-CM | POA: Insufficient documentation

## 2016-03-20 DIAGNOSIS — R0981 Nasal congestion: Secondary | ICD-10-CM | POA: Insufficient documentation

## 2016-03-20 DIAGNOSIS — R05 Cough: Secondary | ICD-10-CM | POA: Diagnosis present

## 2016-03-20 DIAGNOSIS — R69 Illness, unspecified: Secondary | ICD-10-CM

## 2016-03-20 DIAGNOSIS — I10 Essential (primary) hypertension: Secondary | ICD-10-CM | POA: Diagnosis not present

## 2016-03-20 DIAGNOSIS — J3489 Other specified disorders of nose and nasal sinuses: Secondary | ICD-10-CM | POA: Insufficient documentation

## 2016-03-20 DIAGNOSIS — J111 Influenza due to unidentified influenza virus with other respiratory manifestations: Secondary | ICD-10-CM

## 2016-03-20 MED ORDER — ONDANSETRON HCL 4 MG/2ML IJ SOLN: 4.0000 mg | Freq: Once | INTRAMUSCULAR | Status: DC

## 2016-03-20 MED ORDER — ONDANSETRON 4 MG PO TBDP
4.0000 mg | ORAL_TABLET | Freq: Once | ORAL | Status: AC
  Administered 2016-03-20: 4 mg via ORAL

## 2016-03-20 MED ORDER — GUAIFENESIN-CODEINE 100-10 MG/5ML PO SYRP: 5.0000 mL | ORAL_SOLUTION | Freq: Three times a day (TID) | ORAL | 0 refills | Status: DC | PRN

## 2016-03-20 MED ORDER — ONDANSETRON HCL 4 MG PO TABS: 4.0000 mg | ORAL_TABLET | Freq: Every day | ORAL | 1 refills | Status: AC | PRN

## 2016-03-20 MED ORDER — ONDANSETRON 4 MG PO TBDP
ORAL_TABLET | ORAL | Status: AC
  Administered 2016-03-20: 4 mg via ORAL
  Filled 2016-03-20: qty 1

## 2016-03-20 MED ORDER — FLUTICASONE PROPIONATE 50 MCG/ACT NA SUSP: 2.0000 | Freq: Every day | NASAL | 0 refills | Status: AC

## 2016-03-20 NOTE — ED Provider Notes (Signed)
[facility] Emergency Department Provider Note  ____________________________________________  Time seen: Approximately 12:24 PM  I have reviewed the triage vital signs and the nursing notes.   HISTORY  Chief Complaint Facial Pain and Cough    HPI Lauren Mcmillan is a 36 y.o. female presents to the emergency department with 3 days of nonproductive cough, congestion, ear pain, muscle aches, chills. Patient states that she vomited a couple times yesterday. Patient is able to eat and drink without vomiting. Patient had one episode of diarrhea yesterday. She is unsure of fever. Patient has been taking Robitussin and using an inhaler for symptoms.   Past Medical History:  Diagnosis Date  . Hypertension     There are no active problems to display for this patient.   History reviewed. No pertinent surgical history.  Prior to Admission medications   Medication Sig Start Date End Date Taking? Authorizing Provider  fluticasone (FLONASE) 50 MCG/ACT nasal spray Place 2 sprays into both nostrils daily. 03/20/16 03/20/17  Enid Derry, PA-C  guaiFENesin-codeine (ROBITUSSIN AC) 100-10 MG/5ML syrup Take 5 mLs by mouth 3 (three) times daily as needed for cough. 03/20/16 03/22/16  Enid Derry, PA-C  ibuprofen (ADVIL,MOTRIN) 800 MG tablet Take 1 tablet (800 mg total) by mouth every 8 (eight) hours as needed. 09/28/14   Emily Filbert, MD  ondansetron (ZOFRAN) 4 MG tablet Take 1 tablet (4 mg total) by mouth daily as needed for nausea or vomiting. 03/20/16 03/20/17  Enid Derry, PA-C    Allergies Sulfa antibiotics  History reviewed. No pertinent family history.  Social History Social History  Substance Use Topics  . Smoking status: Never Smoker  . Smokeless tobacco: Not on file  . Alcohol use No     Review of Systems  Constitutional: No fever/chills Eyes: No visual changes. No discharge. ENT: Positive for congestion and rhinorrhea. Cardiovascular: No chest  pain. Respiratory: Positive for cough. No SOB. Gastrointestinal: No abdominal pain.  No nausea. No constipation. Musculoskeletal: Positive for musculoskeletal pain. Skin: Negative for rash, abrasions, lacerations, ecchymosis. Neurological: Negative for headaches.   ____________________________________________   PHYSICAL EXAM:  VITAL SIGNS: ED Triage Vitals [03/20/16 1123]  Enc Vitals Group     BP (!) 163/104     Pulse Rate 96     Resp 18     Temp 98.1 F (36.7 C)     Temp Source Oral     SpO2 98 %     Weight 176 lb (79.8 kg)     Height 5\' 5"  (1.651 m)     Head Circumference      Peak Flow      Pain Score 8     Pain Loc      Pain Edu?      Excl. in GC?      Constitutional: Alert and oriented. Well appearing and in no acute distress. Eyes: Conjunctivae are normal. PERRL. EOMI. No discharge. Head: Atraumatic. ENT: No frontal and maxillary sinus tenderness.      Ears: Tympanic membranes pearly gray with good landmarks. No discharge.      Nose: Mild congestion/rhinnorhea.      Mouth/Throat: Mucous membranes are moist. Oropharynx non-erythematous. Tonsils not enlarged. No exudates. Uvula midline. Neck: No stridor.   Hematological/Lymphatic/Immunilogical: No cervical lymphadenopathy. Cardiovascular: Normal rate, regular rhythm. Good peripheral circulation. Respiratory: Normal respiratory effort without tachypnea or retractions. Lungs CTAB. Good air entry to the bases with no decreased or absent breath sounds. Gastrointestinal: Bowel sounds 4 quadrants. Soft and  nontender to palpation. No guarding or rigidity. No palpable masses. No distention. Musculoskeletal: Full range of motion to all extremities. No gross deformities appreciated. Neurologic:  Normal speech and language. No gross focal neurologic deficits are appreciated.  Skin:  Skin is warm, dry and intact. No rash noted. Psychiatric: Mood and affect are normal. Speech and behavior are normal. Patient exhibits  appropriate insight and judgement.   ____________________________________________   LABS (all labs ordered are listed, but only abnormal results are displayed)  Labs Reviewed - No data to display ____________________________________________  EKG   ____________________________________________  RADIOLOGY  No results found.  ____________________________________________    PROCEDURES  Procedure(s) performed:    Procedures    Medications  ondansetron (ZOFRAN-ODT) disintegrating tablet 4 mg (4 mg Oral Given 03/20/16 1303)     ____________________________________________   INITIAL IMPRESSION / ASSESSMENT AND PLAN / ED COURSE  Pertinent labs & imaging results that were available during my care of the patient were reviewed by me and considered in my medical decision making (see chart for details).  Review of the [place] CSRS was performed in accordance of the [facility] prior to dispensing any controlled drugs.     Patient's diagnosis is consistent with influenza-like illness. Vital signs and exam are reassuring. Patient appears well in emergency department. I discussed with patient that running influenza test would not change management since symptoms have been present for greater than 48 hours. Patient elected not to test for influenza. Patient will be discharged home with prescriptions for Flonase, Robitussin, Zofran. Patient is to follow up with PCP as needed or otherwise directed. Patient is given ED precautions to return to the ED for any worsening or new symptoms.     ____________________________________________  FINAL CLINICAL IMPRESSION(S) / ED DIAGNOSES  Final diagnoses:  Influenza-like illness      NEW MEDICATIONS STARTED DURING THIS VISIT:  Discharge Medication List as of 03/20/2016 12:53 PM    START taking these medications   Details  fluticasone (FLONASE) 50 MCG/ACT nasal spray Place 2 sprays into both nostrils daily., Starting Mon 03/20/2016, Until Tue  03/20/2017, Print    guaiFENesin-codeine (ROBITUSSIN AC) 100-10 MG/5ML syrup Take 5 mLs by mouth 3 (three) times daily as needed for cough., Starting Mon 03/20/2016, Until Wed 03/22/2016, Print    ondansetron (ZOFRAN) 4 MG tablet Take 1 tablet (4 mg total) by mouth daily as needed for nausea or vomiting., Starting Mon 03/20/2016, Until Tue 03/20/2017, Print            This chart was dictated using voice recognition software/Dragon. Despite best efforts to proofread, errors can occur which can change the meaning. Any change was purely unintentional.    Enid DerryAshley Milissa Fesperman, PA-C 03/20/16 1329    Jene Everyobert Kinner, MD 03/20/16 1400

## 2016-03-20 NOTE — ED Triage Notes (Signed)
Pt states nasal congestion and cough for 3 days, awake and alert in no acute distress

## 2016-03-22 ENCOUNTER — Encounter: Payer: Self-pay | Admitting: *Deleted

## 2016-03-22 ENCOUNTER — Emergency Department: Payer: Medicaid Other

## 2016-03-22 ENCOUNTER — Emergency Department
Admission: EM | Admit: 2016-03-22 | Discharge: 2016-03-22 | Disposition: A | Discharge status: Home / Self Care | Payer: Medicaid Other | Attending: Emergency Medicine | Admitting: Emergency Medicine

## 2016-03-22 DIAGNOSIS — Y939 Activity, unspecified: Secondary | ICD-10-CM | POA: Insufficient documentation

## 2016-03-22 DIAGNOSIS — I10 Essential (primary) hypertension: Secondary | ICD-10-CM | POA: Insufficient documentation

## 2016-03-22 DIAGNOSIS — Y929 Unspecified place or not applicable: Secondary | ICD-10-CM | POA: Diagnosis not present

## 2016-03-22 DIAGNOSIS — S299XXA Unspecified injury of thorax, initial encounter: Secondary | ICD-10-CM | POA: Diagnosis present

## 2016-03-22 DIAGNOSIS — S2242XA Multiple fractures of ribs, left side, initial encounter for closed fracture: Secondary | ICD-10-CM | POA: Insufficient documentation

## 2016-03-22 DIAGNOSIS — Y999 Unspecified external cause status: Secondary | ICD-10-CM | POA: Insufficient documentation

## 2016-03-22 MED ORDER — HYDROCODONE-ACETAMINOPHEN 5-325 MG PO TABS
1.0000 | ORAL_TABLET | Freq: Once | ORAL | Status: AC
  Administered 2016-03-22: 1 via ORAL
  Filled 2016-03-22: qty 1

## 2016-03-22 MED ORDER — TIZANIDINE HCL 4 MG PO TABS: 4.0000 mg | ORAL_TABLET | Freq: Three times a day (TID) | ORAL | 0 refills | Status: AC

## 2016-03-22 MED ORDER — MELOXICAM 15 MG PO TABS: 15.0000 mg | ORAL_TABLET | Freq: Every day | ORAL | 0 refills | Status: DC
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
## 2016-03-22 NOTE — Discharge Instructions (Signed)
Follow up with pain management. Return to the ER for new symptoms of concern if unable to schedule an appointment with primary care or pain management.

## 2016-03-22 NOTE — ED Notes (Signed)
Pt verbalized continued pain. Provider at bedside to describe pain management with prescriptions. Pt able to ambulate independently and in NAD at time of difficulty. No difficulty breathing noted at this time.
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
## 2016-04-17 ENCOUNTER — Emergency Department: Payer: Medicaid Other

## 2016-04-17 ENCOUNTER — Encounter: Payer: Self-pay | Admitting: Emergency Medicine

## 2016-04-17 ENCOUNTER — Emergency Department
Admission: EM | Admit: 2016-04-17 | Discharge: 2016-04-17 | Disposition: A | Discharge status: Home / Self Care | Payer: Medicaid Other | Attending: Emergency Medicine | Admitting: Emergency Medicine

## 2016-04-17 DIAGNOSIS — R109 Unspecified abdominal pain: Secondary | ICD-10-CM

## 2016-04-17 DIAGNOSIS — I10 Essential (primary) hypertension: Secondary | ICD-10-CM | POA: Diagnosis not present

## 2016-04-17 DIAGNOSIS — N390 Urinary tract infection, site not specified: Secondary | ICD-10-CM | POA: Diagnosis not present

## 2016-04-17 DIAGNOSIS — R1030 Lower abdominal pain, unspecified: Secondary | ICD-10-CM

## 2016-04-17 HISTORY — DX: Endometriosis, unspecified: N80.9

## 2016-04-17 HISTORY — DX: Calculus of kidney: N20.0

## 2016-04-17 LAB — URINALYSIS, ROUTINE W REFLEX MICROSCOPIC
BILIRUBIN URINE: NEGATIVE
Glucose, UA: NEGATIVE mg/dL
HGB URINE DIPSTICK: NEGATIVE
Ketones, ur: NEGATIVE mg/dL
NITRITE: NEGATIVE
PROTEIN: 30 mg/dL — AB
Specific Gravity, Urine: 1.024 (ref 1.005–1.030)
pH: 6 (ref 5.0–8.0)

## 2016-04-17 LAB — COMPREHENSIVE METABOLIC PANEL
ALBUMIN: 4.1 g/dL (ref 3.5–5.0)
ALT: 14 U/L (ref 14–54)
ANION GAP: 9 (ref 5–15)
AST: 28 U/L (ref 15–41)
Alkaline Phosphatase: 69 U/L (ref 38–126)
BILIRUBIN TOTAL: 0.5 mg/dL (ref 0.3–1.2)
BUN: 11 mg/dL (ref 6–20)
CO2: 23 mmol/L (ref 22–32)
Calcium: 9 mg/dL (ref 8.9–10.3)
Chloride: 105 mmol/L (ref 101–111)
Creatinine, Ser: 0.99 mg/dL (ref 0.44–1.00)
GFR calc Af Amer: >60 mL/min (ref >60)
GFR calc non Af Amer: >60 mL/min (ref >60)
GLUCOSE: 90 mg/dL (ref 65–99)
Potassium: 3.6 mmol/L (ref 3.5–5.1)
SODIUM: 137 mmol/L (ref 135–145)
TOTAL PROTEIN: 8.2 g/dL — AB (ref 6.5–8.1)

## 2016-04-17 LAB — CBC
HEMATOCRIT: 41.8 % (ref 35.0–47.0)
HEMOGLOBIN: 14 g/dL (ref 12.0–16.0)
MCH: 29.3 pg (ref 26.0–34.0)
MCHC: 33.4 g/dL (ref 32.0–36.0)
MCV: 87.9 fL (ref 80.0–100.0)
Platelets: 352 10*3/uL (ref 150–440)
RBC: 4.76 MIL/uL (ref 3.80–5.20)
RDW: 15.8 % — ABNORMAL HIGH (ref 11.5–14.5)
WBC: 8.2 10*3/uL (ref 3.6–11.0)

## 2016-04-17 LAB — PREGNANCY, URINE: PREG TEST UR: NEGATIVE

## 2016-04-17 MED ORDER — OXYCODONE-ACETAMINOPHEN 5-325 MG PO TABS
1.0000 | ORAL_TABLET | Freq: Once | ORAL | Status: AC
  Administered 2016-04-17: 1 via ORAL
  Filled 2016-04-17: qty 1

## 2016-04-17 MED ORDER — ONDANSETRON HCL 4 MG/2ML IJ SOLN
INTRAMUSCULAR | Status: AC
  Filled 2016-04-17: qty 2

## 2016-04-17 MED ORDER — ONDANSETRON 4 MG PO TBDP
4.0000 mg | ORAL_TABLET | Freq: Once | ORAL | Status: AC
  Administered 2016-04-17: 4 mg via ORAL
  Filled 2016-04-17: qty 1

## 2016-04-17 MED ORDER — KETOROLAC TROMETHAMINE 30 MG/ML IJ SOLN
30.0000 mg | Freq: Once | INTRAMUSCULAR | Status: AC
  Administered 2016-04-17: 30 mg via INTRAVENOUS
  Filled 2016-04-17: qty 1

## 2016-04-17 MED ORDER — ONDANSETRON HCL 4 MG/2ML IJ SOLN
INTRAMUSCULAR | Status: AC
  Administered 2016-04-17: 4 mg via INTRAVENOUS
  Filled 2016-04-17: qty 2

## 2016-04-17 MED ORDER — HYDROMORPHONE HCL 1 MG/ML IJ SOLN
INTRAMUSCULAR | Status: AC
  Administered 2016-04-17: 1 mg via INTRAVENOUS
  Filled 2016-04-17: qty 1

## 2016-04-17 MED ORDER — HYDROMORPHONE HCL 1 MG/ML IJ SOLN
1.0000 mg | Freq: Once | INTRAMUSCULAR | Status: AC
  Administered 2016-04-17: 1 mg via INTRAVENOUS

## 2016-04-17 MED ORDER — ONDANSETRON HCL 4 MG/2ML IJ SOLN
4.0000 mg | Freq: Once | INTRAMUSCULAR | Status: AC
  Administered 2016-04-17: 4 mg via INTRAVENOUS

## 2016-04-17 MED ORDER — DIAZEPAM 2 MG PO TABS: 2.0000 mg | ORAL_TABLET | Freq: Three times a day (TID) | ORAL | 0 refills | Status: AC | PRN

## 2016-04-17 MED ORDER — DEXTROSE 5 % IV SOLN: 1.0000 g | INTRAVENOUS | Status: DC

## 2016-04-17 MED ORDER — HYDROMORPHONE HCL 1 MG/ML IJ SOLN
INTRAMUSCULAR | Status: AC
  Filled 2016-04-17: qty 1

## 2016-04-17 MED ORDER — SODIUM CHLORIDE 0.9 % IV BOLUS (SEPSIS)
1000.0000 mL | Freq: Once | INTRAVENOUS | Status: AC
  Administered 2016-04-17: 1000 mL via INTRAVENOUS

## 2016-04-17 MED ORDER — ONDANSETRON 4 MG PO TBDP: 4.0000 mg | ORAL_TABLET | Freq: Three times a day (TID) | ORAL | 0 refills | Status: AC | PRN

## 2016-04-17 MED ORDER — CEPHALEXIN 500 MG PO CAPS: 500.0000 mg | ORAL_CAPSULE | Freq: Three times a day (TID) | ORAL | 0 refills | Status: AC

## 2016-04-17 MED ORDER — OXYCODONE-ACETAMINOPHEN 5-325 MG PO TABS: 1.0000 | ORAL_TABLET | ORAL | 0 refills | Status: AC | PRN

## 2016-04-17 MED ORDER — LORAZEPAM 2 MG/ML IJ SOLN
INTRAMUSCULAR | Status: AC
  Administered 2016-04-17: 0.5 mg via INTRAVENOUS
  Filled 2016-04-17: qty 1

## 2016-04-17 MED ORDER — CEFTRIAXONE SODIUM-DEXTROSE 1-3.74 GM-% IV SOLR
1.0000 g | Freq: Once | INTRAVENOUS | Status: AC
  Administered 2016-04-17: 1 g via INTRAVENOUS
  Filled 2016-04-17: qty 50

## 2016-04-17 MED ORDER — LORAZEPAM 2 MG/ML IJ SOLN
0.5000 mg | Freq: Once | INTRAMUSCULAR | Status: AC
Start: 2016-04-17 — End: 2016-04-17
  Administered 2016-04-17: 0.5 mg via INTRAVENOUS

## 2016-04-17 MED ORDER — DIAZEPAM 5 MG/ML IJ SOLN: 2.0000 mg | Freq: Once | INTRAMUSCULAR | Status: DC

## 2016-04-17 NOTE — ED Notes (Addendum)
Pt actively vomiting in subwait. medicated per protocol.

## 2016-04-17 NOTE — ED Notes (Signed)
Patient with sudden onset of right flank, nausea and vomiting that started about an hour ago. Patient states that she has had similar pain in the past with a kidney stone.

## 2016-04-17 NOTE — ED Provider Notes (Signed)
[facility] Emergency Department Provider Note   ____________________________________________   First MD Initiated Contact with Patient 04/17/16 [phone]     (approximate)  I have reviewed the triage vital signs and the nursing notes.   HISTORY  Chief Complaint Flank Pain and Emesis    HPI Lauren Mcmillan is a 36 y.o. female who presents to the ED from home with a chief complaint of right flank pain. Patient has a history of kidney stones not requiring surgical intervention. Awoke prior to arrival with sudden onset of right flank pain, nausea and vomiting. Went to bed in her usual state of good health. Denies associated fever, chills, chest pain, shortness of breath, diarrhea.Reports radiation of flank pain into her right abdomen; also reports dysuria. Denies recent travel or trauma. Nothing makes her symptoms better or worse.   Past Medical History:  Diagnosis Date  . Endometriosis   . Hypertension   . Kidney stone     There are no active problems to display for this patient.   Past Surgical History:  Procedure Laterality Date  . LAPAROSCOPY      Prior to Admission medications   Medication Sig Start Date End Date Taking? Authorizing Provider  cephALEXin (KEFLEX) 500 MG capsule Take 1 capsule (500 mg total) by mouth 3 (three) times daily. 04/17/16   Irean HongJade J Dierdre Mccalip, MD  diazepam (VALIUM) 2 MG tablet Take 1 tablet (2 mg total) by mouth every 8 (eight) hours as needed for muscle spasms. 04/17/16   Irean HongJade J Tawanda Schall, MD  fluticasone (FLONASE) 50 MCG/ACT nasal spray Place 2 sprays into both nostrils daily. 03/20/16 03/20/17  Enid DerryAshley Wagner, PA-C  meloxicam (MOBIC) 15 MG tablet Take 1 tablet (15 mg total) by mouth daily. 03/22/16   Chinita Pesterari B Triplett, FNP  ondansetron (ZOFRAN ODT) 4 MG disintegrating tablet Take 1 tablet (4 mg total) by mouth every 8 (eight) hours as needed for nausea or vomiting. 04/17/16   Irean HongJade J Paytin Ramakrishnan, MD  ondansetron (ZOFRAN) 4 MG tablet Take 1 tablet  (4 mg total) by mouth daily as needed for nausea or vomiting. 03/20/16 03/20/17  Enid DerryAshley Wagner, PA-C  oxyCODONE-acetaminophen (ROXICET) 5-325 MG tablet Take 1 tablet by mouth every 4 (four) hours as needed for severe pain. 04/17/16   Irean HongJade J Blessings Inglett, MD  tiZANidine (ZANAFLEX) 4 MG tablet Take 1 tablet (4 mg total) by mouth 3 (three) times daily. 03/22/16   Chinita Pesterari B Triplett, FNP    Allergies Sulfa antibiotics  No family history on file.  Social History Social History  Substance Use Topics  . Smoking status: Never Smoker  . Smokeless tobacco: Never Used  . Alcohol use No    Review of Systems  Constitutional: No fever/chills. Eyes: No visual changes. ENT: No sore throat. Cardiovascular: Denies chest pain. Respiratory: Denies shortness of breath. Gastrointestinal: Positive for abdominal pain.  Positive for nausea and vomiting.  No diarrhea.  No constipation. Positive for CVA tenderness. Genitourinary: Positive for dysuria. Musculoskeletal: Negative for back pain. Skin: Negative for rash. Neurological: Negative for headaches, focal weakness or numbness.  10-point ROS otherwise negative.  ____________________________________________   PHYSICAL EXAM:  VITAL SIGNS: ED Triage Vitals  Enc Vitals Group     BP 04/17/16 0400 (!) 160/118     Pulse Rate 04/17/16 0400 (!) 119     Resp 04/17/16 0400 (!) 24     Temp 04/17/16 0400 98.6 F (37 C)     Temp Source 04/17/16 0400 Oral  SpO2 04/17/16 0400 98 %     Weight 04/17/16 0401 175 lb (79.4 kg)     Height 04/17/16 0401 5\' 6"  (1.676 m)     Head Circumference --      Peak Flow --      Pain Score 04/17/16 0401 10     Pain Loc --      Pain Edu? --      Excl. in GC? --     Constitutional: Alert and oriented. Well appearing and in moderate acute distress. Eyes: Conjunctivae are normal. PERRL. EOMI. Head: Atraumatic. Nose: No congestion/rhinnorhea. Mouth/Throat: Mucous membranes are moist.  Oropharynx non-erythematous. Neck: No  stridor.   Cardiovascular: Normal rate, regular rhythm. Grossly normal heart sounds.  Good peripheral circulation. Respiratory: Normal respiratory effort.  No retractions. Lungs CTAB. Gastrointestinal: Soft and mildly tender to palpation suprapubic area without rebound or guarding. No distention. No abdominal bruits. Mild right CVA tenderness. Musculoskeletal: No lower extremity tenderness nor edema.  No joint effusions. Neurologic:  Normal speech and language. No gross focal neurologic deficits are appreciated. No gait instability. Skin:  Skin is warm, dry and intact. No rash noted. Psychiatric: Mood and affect are normal. Speech and behavior are normal.  ____________________________________________   LABS (all labs ordered are listed, but only abnormal results are displayed)  Labs Reviewed  COMPREHENSIVE METABOLIC PANEL - Abnormal; Notable for the following:       Result Value   Total Protein 8.2 (*)    All other components within normal limits  CBC - Abnormal; Notable for the following:    RDW 15.8 (*)    All other components within normal limits  URINALYSIS, ROUTINE W REFLEX MICROSCOPIC - Abnormal; Notable for the following:    Color, Urine YELLOW (*)    APPearance HAZY (*)    Protein, ur 30 (*)    Leukocytes, UA LARGE (*)    Bacteria, UA RARE (*)    Squamous Epithelial / LPF 0-5 (*)    All other components within normal limits  PREGNANCY, URINE   ____________________________________________  EKG  None ____________________________________________  RADIOLOGY  CT renal stone study interpreted per Dr. Gwenyth Bender: 1. No hydronephrosis or nephrolithiasis. Mild bilateral renal  parenchyma atrophy for age.  2. Small pockets of air within the urinary bladder. Correlation with  urinalysis and history of recent instrumentation recommended.  3. No evidence of bowel obstruction or active inflammation. Normal  appendix.    ____________________________________________   PROCEDURES  Procedure(s) performed: None  Procedures  Critical Care performed: No  ____________________________________________   INITIAL IMPRESSION / ASSESSMENT AND PLAN / ED COURSE  Pertinent labs & imaging results that were available during my care of the patient were reviewed by me and considered in my medical decision making (see chart for details).  36 year old female with a history of kidney stones who presents with sudden onset right flank pain, nausea and vomiting. Clinically patient's symptoms are consistent with kidney stone. Laboratory and urinalysis results remarkable for UTI. Will initiate IV fluid resuscitation, analgesia and antiemetic. Will proceed with CT renal colic study to evaluate for obstructive kidney stone.   ____________________________________________   FINAL CLINICAL IMPRESSION(S) / ED DIAGNOSES  Final diagnoses:  Lower urinary tract infectious disease  Right flank pain  Lower abdominal pain      NEW MEDICATIONS STARTED DURING THIS VISIT:  New Prescriptions   CEPHALEXIN (KEFLEX) 500 MG CAPSULE    Take 1 capsule (500 mg total) by mouth 3 (three) times daily.   DIAZEPAM (  VALIUM) 2 MG TABLET    Take 1 tablet (2 mg total) by mouth every 8 (eight) hours as needed for muscle spasms.   ONDANSETRON (ZOFRAN ODT) 4 MG DISINTEGRATING TABLET    Take 1 tablet (4 mg total) by mouth every 8 (eight) hours as needed for nausea or vomiting.   OXYCODONE-ACETAMINOPHEN (ROXICET) 5-325 MG TABLET    Take 1 tablet by mouth every 4 (four) hours as needed for severe pain.     Note:  This document was prepared using Dragon voice recognition software and may include unintentional dictation errors.    Irean Hong, MD 04/17/16 [phone]

## 2016-04-17 NOTE — ED Notes (Signed)
Pt given warm blanket and placed in recliner for comfort. Visitor sitting with pt.

## 2016-04-17 NOTE — ED Provider Notes (Signed)
-----------------------------------------   10:09 AM on 04/17/2016 -----------------------------------------   Blood pressure 137/79, pulse 97, temperature 97.9 F (36.6 C), temperature source Oral, resp. rate 16, height 5\' 6"  (1.676 m), weight 175 lb (79.4 kg), SpO2 99 %.  Assuming care from Dr. Dolores FrameSung of [facility] Denise Ernest Mallicksley is a 36 y.o. female with a chief complaint of Flank Pain and Emesis .   Asked by Dr. Dolores FrameSung to repeat VS after IVF and PO challenge this patient with flank pain and UTI. Negative CT renal. Patient feels markedly improved. Vital signs normalize. She is tolerating by mouth. Patient will be discharged home with prescriptions and instructions left by Dr. Kinnie FeilSung   Wrightwood Khani Paino, MD 04/17/16 1010

## 2016-04-17 NOTE — Discharge Instructions (Signed)
1. Take antibiotic as prescribed (Keflex 500 mg 3 times daily 7 days). 2. You may take medicines as needed for pain, nausea and bladder spasms (Percocet/Zofran/Valium #30). 3. Return to the ER for worsening symptoms, persistent vomiting, fever or other concerns.

## 2016-04-17 NOTE — ED Notes (Signed)
Patient transported to CT 

## 2016-11-24 ENCOUNTER — Emergency Department
Admission: EM | Admit: 2016-11-24 | Discharge: 2016-11-24 | Disposition: A | Discharge status: Home / Self Care | Payer: Self-pay | Attending: Emergency Medicine | Admitting: Emergency Medicine

## 2016-11-24 DIAGNOSIS — I10 Essential (primary) hypertension: Secondary | ICD-10-CM | POA: Insufficient documentation

## 2016-11-24 DIAGNOSIS — T7621XA Adult sexual abuse, suspected, initial encounter: Secondary | ICD-10-CM | POA: Insufficient documentation

## 2016-11-24 DIAGNOSIS — Y939 Activity, unspecified: Secondary | ICD-10-CM | POA: Insufficient documentation

## 2016-11-24 DIAGNOSIS — Z79899 Other long term (current) drug therapy: Secondary | ICD-10-CM | POA: Insufficient documentation

## 2016-11-24 DIAGNOSIS — Z23 Encounter for immunization: Secondary | ICD-10-CM | POA: Insufficient documentation

## 2016-11-24 DIAGNOSIS — T7421XA Adult sexual abuse, confirmed, initial encounter: Secondary | ICD-10-CM

## 2016-11-24 DIAGNOSIS — Y999 Unspecified external cause status: Secondary | ICD-10-CM | POA: Insufficient documentation

## 2016-11-24 DIAGNOSIS — Y92009 Unspecified place in unspecified non-institutional (private) residence as the place of occurrence of the external cause: Secondary | ICD-10-CM | POA: Insufficient documentation

## 2016-11-24 LAB — URINE DRUG SCREEN, QUALITATIVE (ARMC ONLY)
Amphetamines, Ur Screen: NOT DETECTED
BARBITURATES, UR SCREEN: NOT DETECTED
BENZODIAZEPINE, UR SCRN: NOT DETECTED
CANNABINOID 50 NG, UR ~~LOC~~: NOT DETECTED
Cocaine Metabolite,Ur ~~LOC~~: NOT DETECTED
MDMA (Ecstasy)Ur Screen: NOT DETECTED
Methadone Scn, Ur: NOT DETECTED
OPIATE, UR SCREEN: NOT DETECTED
PHENCYCLIDINE (PCP) UR S: NOT DETECTED
Tricyclic, Ur Screen: NOT DETECTED

## 2016-11-24 LAB — RAPID HIV SCREEN (HIV 1/2 AB+AG)
HIV 1/2 Antibodies: NONREACTIVE
HIV-1 P24 Antigen - HIV24: NONREACTIVE

## 2016-11-24 MED ORDER — METRONIDAZOLE 500 MG PO TABS: 500.0000 mg | ORAL_TABLET | Freq: Two times a day (BID) | ORAL | 0 refills | Status: AC

## 2016-11-24 MED ORDER — CEFTRIAXONE SODIUM 250 MG IJ SOLR
250.0000 mg | Freq: Once | INTRAMUSCULAR | Status: AC
  Administered 2016-11-24: 250 mg via INTRAMUSCULAR

## 2016-11-24 MED ORDER — AZITHROMYCIN 500 MG PO TABS
ORAL_TABLET | ORAL | Status: AC
  Filled 2016-11-24: qty 2

## 2016-11-24 MED ORDER — DOLUTEGRAVIR SODIUM 50 MG PO TABS: 50.0000 mg | ORAL_TABLET | Freq: Every day | ORAL | 0 refills | Status: AC

## 2016-11-24 MED ORDER — CEFTRIAXONE SODIUM 250 MG IJ SOLR
INTRAMUSCULAR | Status: AC
  Administered 2016-11-24: 250 mg via INTRAMUSCULAR
  Filled 2016-11-24: qty 250

## 2016-11-24 MED ORDER — ULIPRISTAL ACETATE 30 MG PO TABS
30.0000 mg | ORAL_TABLET | Freq: Once | ORAL | Status: AC
Start: 2016-11-24 — End: 2016-11-24
  Administered 2016-11-24: 30 mg via ORAL

## 2016-11-24 MED ORDER — TETANUS-DIPHTH-ACELL PERTUSSIS 5-2.5-18.5 LF-MCG/0.5 IM SUSP
0.5000 mL | Freq: Once | INTRAMUSCULAR | Status: AC
  Administered 2016-11-24: 0.5 mL via INTRAMUSCULAR
  Filled 2016-11-24: qty 0.5

## 2016-11-24 MED ORDER — AZITHROMYCIN 1 G PO PACK
1.0000 g | PACK | Freq: Once | ORAL | Status: AC
  Administered 2016-11-24: 1 g via ORAL
  Filled 2016-11-24: qty 1

## 2016-11-24 MED ORDER — LIDOCAINE HCL (PF) 1 % IJ SOLN
INTRAMUSCULAR | Status: AC
  Administered 2016-11-24: 1 mL
  Filled 2016-11-24: qty 5

## 2016-11-24 MED ORDER — DOLUTEGRAVIR SODIUM 50 MG PO TABS
50.0000 mg | ORAL_TABLET | Freq: Once | ORAL | Status: AC
Start: 2016-11-24 — End: 2016-11-24
  Administered 2016-11-24: 50 mg via ORAL
  Filled 2016-11-24: qty 1

## 2016-11-24 MED ORDER — ULIPRISTAL ACETATE 30 MG PO TABS
ORAL_TABLET | ORAL | Status: AC
  Administered 2016-11-24: 30 mg via ORAL
  Filled 2016-11-24: qty 1

## 2016-11-24 MED ORDER — EMTRICITABINE-TENOFOVIR AF 200-25 MG PO TABS
1.0000 | ORAL_TABLET | Freq: Once | ORAL | Status: AC
  Administered 2016-11-24: 1 via ORAL
  Filled 2016-11-24: qty 1

## 2016-11-24 MED ORDER — ACETAMINOPHEN 500 MG PO TABS
1000.0000 mg | ORAL_TABLET | Freq: Once | ORAL | Status: AC
  Administered 2016-11-24: 1000 mg via ORAL

## 2016-11-24 MED ORDER — ACETAMINOPHEN 500 MG PO TABS
ORAL_TABLET | ORAL | Status: AC
  Filled 2016-11-24: qty 2

## 2016-11-24 MED ORDER — EMTRICITABINE-TENOFOVIR DF 200-300 MG PO TABS: 1.0000 | ORAL_TABLET | Freq: Every day | ORAL | 0 refills | Status: AC

## 2016-11-24 NOTE — ED Notes (Signed)
Report given Allison,RN
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
## 2016-11-24 NOTE — ED Notes (Signed)
Patient is alert and oriented on arrival, appears tearful.  [place] [employer] at bedside.

## 2016-11-24 NOTE — ED Notes (Signed)
Bethann Berkshire, SANE RN here to see patient.

## 2016-11-24 NOTE — ED Notes (Signed)
[employer] called twice, no response back.
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
## 2016-11-24 NOTE — ED Notes (Signed)
POC PREG NEGATIVE  

## 2016-11-24 NOTE — ED Notes (Signed)

## 2016-11-24 NOTE — ED Notes (Signed)
ED Provider at bedside. 
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
## 2016-11-25 LAB — HEPATITIS PANEL, ACUTE
HCV Ab: <0.1 s/co ratio (ref 0.0–0.9)
HEP A IGM: NEGATIVE
Hep B C IgM: NEGATIVE
Hepatitis B Surface Ag: NEGATIVE

## 2016-11-25 LAB — RPR: RPR Ser Ql: NONREACTIVE

## 2021-04-22 ENCOUNTER — Emergency Department: Payer: Self-pay

## 2021-04-22 ENCOUNTER — Emergency Department
Admission: EM | Admit: 2021-04-22 | Discharge: 2021-04-22 | Disposition: A | Discharge status: Home / Self Care | Payer: Self-pay | Attending: Emergency Medicine | Admitting: Emergency Medicine

## 2021-04-22 ENCOUNTER — Other Ambulatory Visit: Payer: Self-pay

## 2021-04-22 ENCOUNTER — Encounter: Payer: Self-pay | Admitting: Emergency Medicine

## 2021-04-22 DIAGNOSIS — X501XXA Overexertion from prolonged static or awkward postures, initial encounter: Secondary | ICD-10-CM | POA: Insufficient documentation

## 2021-04-22 DIAGNOSIS — M25571 Pain in right ankle and joints of right foot: Secondary | ICD-10-CM

## 2021-04-22 DIAGNOSIS — I10 Essential (primary) hypertension: Secondary | ICD-10-CM | POA: Insufficient documentation

## 2021-04-22 DIAGNOSIS — M25473 Effusion, unspecified ankle: Secondary | ICD-10-CM

## 2021-04-22 DIAGNOSIS — M25579 Pain in unspecified ankle and joints of unspecified foot: Secondary | ICD-10-CM

## 2021-04-22 MED ORDER — OXYCODONE-ACETAMINOPHEN 5-325 MG PO TABS
1.0000 | ORAL_TABLET | Freq: Once | ORAL | Status: AC
  Administered 2021-04-22: 1 via ORAL
  Filled 2021-04-22: qty 1

## 2021-04-22 MED ORDER — ONDANSETRON 4 MG PO TBDP
4.0000 mg | ORAL_TABLET | Freq: Once | ORAL | Status: AC
  Administered 2021-04-22: 4 mg via ORAL
  Filled 2021-04-22: qty 1

## 2021-04-22 MED ORDER — MELOXICAM 15 MG PO TABS: 15.0000 mg | ORAL_TABLET | Freq: Every day | ORAL | 1 refills | Status: AC

## 2021-04-22 NOTE — Discharge Instructions (Addendum)
Take Meloxicam once daily for pain and inflammation.  
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
## 2021-04-22 NOTE — ED Provider Notes (Signed)
[facility]-EMERGENCY DEPARTMENT  ____________________________________________  Time seen: Approximately 8:14 PM  I have reviewed the triage vital signs and the nursing notes.   HISTORY  Chief Complaint Foot Pain   Historian Patient     HPI Lauren Mcmillan is a 41 y.o. female presents to the emergency department with right lateral and medial ankle pain after an inversion type injury.  No similar injuries in the past.  Patient has had difficulty bearing weight since injury occurred.   Past Medical History:  Diagnosis Date   Endometriosis    Hypertension    Kidney stone      Immunizations up to date:  Yes.     Past Medical History:  Diagnosis Date   Endometriosis    Hypertension    Kidney stone     There are no problems to display for this patient.   Past Surgical History:  Procedure Laterality Date   LAPAROSCOPY      Prior to Admission medications   Medication Sig Start Date End Date Taking? Authorizing Provider  meloxicam (MOBIC) 15 MG tablet Take 1 tablet (15 mg total) by mouth daily for 7 days. 04/22/21 04/29/21 Yes Pia Mau M, PA-C  cephALEXin (KEFLEX) 500 MG capsule Take 1 capsule (500 mg total) by mouth 3 (three) times daily. 04/17/16   Irean Hong, MD  diazepam (VALIUM) 2 MG tablet Take 1 tablet (2 mg total) by mouth every 8 (eight) hours as needed for muscle spasms. 04/17/16   Irean Hong, MD  fluticasone (FLONASE) 50 MCG/ACT nasal spray Place 2 sprays into both nostrils daily. 03/20/16 03/20/17  Enid Derry, PA-C  ondansetron (ZOFRAN ODT) 4 MG disintegrating tablet Take 1 tablet (4 mg total) by mouth every 8 (eight) hours as needed for nausea or vomiting. 04/17/16   Irean Hong, MD  oxyCODONE-acetaminophen (ROXICET) 5-325 MG tablet Take 1 tablet by mouth every 4 (four) hours as needed for severe pain. 04/17/16   Irean Hong, MD  tiZANidine (ZANAFLEX) 4 MG tablet Take 1 tablet (4 mg total) by mouth 3 (three) times daily. 03/22/16   Triplett, Rulon Eisenmenger B,  FNP    Allergies Sulfa antibiotics  History reviewed. No pertinent family history.  Social History Social History   Tobacco Use   Smoking status: Never   Smokeless tobacco: Never  Substance Use Topics   Alcohol use: No   Drug use: No     Review of Systems  Constitutional: No fever/chills Eyes:  No discharge ENT: No upper respiratory complaints. Respiratory: no cough. No SOB/ use of accessory muscles to breath Gastrointestinal:   No nausea, no vomiting.  No diarrhea.  No constipation. Musculoskeletal: Patient has right ankle pain.  Skin: Negative for rash, abrasions, lacerations, ecchymosis.    ____________________________________________   PHYSICAL EXAM:  VITAL SIGNS: ED Triage Vitals  Enc Vitals Group     BP 04/22/21 1834 (!) 156/97     Pulse Rate 04/22/21 1834 100     Resp 04/22/21 1834 18     Temp 04/22/21 1834 98.1 F (36.7 C)     Temp Source 04/22/21 1834 Oral     SpO2 04/22/21 1834 98 %     Weight 04/22/21 1805 190 lb 0.6 oz (86.2 kg)     Height 04/22/21 1805 5\' 5"  (1.651 m)     Head Circumference --      Peak Flow --      Pain Score 04/22/21 1805 8     Pain Loc --  Pain Edu? --      Excl. in GC? --      Constitutional: Alert and oriented. Well appearing and in no acute distress. Eyes: Conjunctivae are normal. PERRL. EOMI. Head: Atraumatic. ENT: Cardiovascular: Normal rate, regular rhythm. Normal S1 and S2.  Good peripheral circulation. Respiratory: Normal respiratory effort without tachypnea or retractions. Lungs CTAB. Good air entry to the bases with no decreased or absent breath sounds Gastrointestinal: Bowel sounds x 4 quadrants. Soft and nontender to palpation. No guarding or rigidity. No distention. Musculoskeletal: Patient has tenderness to palpation over right anterior talofibular ligament and deltoid ligament.  Palpable dorsalis pedis pulse, right.  Capillary refill less than 2 seconds on the right. Neurologic:  Normal for age. No  gross focal neurologic deficits are appreciated.  Skin:  Skin is warm, dry and intact. No rash noted. Psychiatric: Mood and affect are normal for age. Speech and behavior are normal.   ____________________________________________   LABS (all labs ordered are listed, but only abnormal results are displayed)  Labs Reviewed - No data to display ____________________________________________  EKG   ____________________________________________  RADIOLOGY Geraldo Pitter, personally viewed and evaluated these images (plain radiographs) as part of my medical decision making, as well as reviewing the written report by the radiologist.  DG Ankle Complete Right  Result Date: 04/22/2021 CLINICAL DATA:  Right foot and ankle pain and swelling EXAM: RIGHT FOOT COMPLETE - 3+ VIEW; RIGHT ANKLE - COMPLETE 3+ VIEW COMPARISON:  08/29/2012 FINDINGS: No evidence of acute fracture or dislocation. Chronic irregularity at the base of the fifth metatarsal, unchanged and likely sequela of remote trauma. Joint spaces are preserved. Prominent os trigonum. There is soft tissue swelling about the ankle and hindfoot, more pronounced laterally. IMPRESSION: Soft tissue swelling without evidence of acute fracture or dislocation of the right foot or ankle. Electronically Signed   By: Duanne Guess D.O.   On: 04/22/2021 18:42   DG Foot Complete Right  Result Date: 04/22/2021 CLINICAL DATA:  Right foot and ankle pain and swelling EXAM: RIGHT FOOT COMPLETE - 3+ VIEW; RIGHT ANKLE - COMPLETE 3+ VIEW COMPARISON:  08/29/2012 FINDINGS: No evidence of acute fracture or dislocation. Chronic irregularity at the base of the fifth metatarsal, unchanged and likely sequela of remote trauma. Joint spaces are preserved. Prominent os trigonum. There is soft tissue swelling about the ankle and hindfoot, more pronounced laterally. IMPRESSION: Soft tissue swelling without evidence of acute fracture or dislocation of the right foot or ankle.  Electronically Signed   By: Duanne Guess D.O.   On: 04/22/2021 18:42    ____________________________________________    PROCEDURES  Procedure(s) performed:     Procedures     Medications  oxyCODONE-acetaminophen (PERCOCET/ROXICET) 5-325 MG per tablet 1 tablet (has no administration in time range)  ondansetron (ZOFRAN-ODT) disintegrating tablet 4 mg (has no administration in time range)     ____________________________________________   INITIAL IMPRESSION / ASSESSMENT AND PLAN / ED COURSE  Pertinent labs & imaging results that were available during my care of the patient were reviewed by me and considered in my medical decision making (see chart for details).      Assessment and plan Ankle sprain 41 year old female presents to the emergency department with acute right ankle pain.  No abnormality visualized on x-ray.  We will treat with meloxicam.  Crutches and Ace wrap provided.  Follow-up with podiatry recommended if symptoms do not improve.     ____________________________________________  FINAL CLINICAL IMPRESSION(S) / ED DIAGNOSES  Final diagnoses:  Acute right ankle pain      NEW MEDICATIONS STARTED DURING THIS VISIT:  ED Discharge Orders          Ordered    meloxicam (MOBIC) 15 MG tablet  Daily        04/22/21 1939                This chart was dictated using voice recognition software/Dragon. Despite best efforts to proofread, errors can occur which can change the meaning. Any change was purely unintentional.     Gasper Lloyd 04/22/21 2032    Gilles Chiquito, MD 04/22/21 2141

## 2023-12-05 ENCOUNTER — Other Ambulatory Visit: Payer: Self-pay | Admitting: Physician Assistant

## 2023-12-05 DIAGNOSIS — Z1231 Encounter for screening mammogram for malignant neoplasm of breast: Secondary | ICD-10-CM

## 2023-12-10 ENCOUNTER — Encounter: Payer: Self-pay | Admitting: Physician Assistant

## 2023-12-10 DIAGNOSIS — Z1231 Encounter for screening mammogram for malignant neoplasm of breast: Secondary | ICD-10-CM
